# Patient Record
Sex: Male | Born: 1945 | Race: White | Hispanic: No | Marital: Married | Smoking: Never smoker
Health system: Southern US, Community
[De-identification: ages and names within clinical notes are randomized; demographics above are authoritative.]

## PROBLEM LIST (undated history)

## (undated) DIAGNOSIS — C801 Malignant (primary) neoplasm, unspecified: Secondary | ICD-10-CM

## (undated) DIAGNOSIS — G709 Myoneural disorder, unspecified: Secondary | ICD-10-CM

## (undated) DIAGNOSIS — H269 Unspecified cataract: Secondary | ICD-10-CM

## (undated) DIAGNOSIS — M1990 Unspecified osteoarthritis, unspecified site: Secondary | ICD-10-CM

## (undated) DIAGNOSIS — E785 Hyperlipidemia, unspecified: Secondary | ICD-10-CM

## (undated) HISTORY — DX: Unspecified cataract: H26.9

## (undated) HISTORY — PX: CATARACT EXTRACTION: SUR2

## (undated) HISTORY — DX: Malignant (primary) neoplasm, unspecified: C80.1

## (undated) HISTORY — PX: CATARACT EXTRACTION, BILATERAL: SHX1313

## (undated) HISTORY — DX: Unspecified osteoarthritis, unspecified site: M19.90

## (undated) HISTORY — DX: Myoneural disorder, unspecified: G70.9

## (undated) HISTORY — DX: Hyperlipidemia, unspecified: E78.5

## (undated) HISTORY — PX: DENTAL SURGERY: SHX609

---

## 2015-06-18 ENCOUNTER — Encounter (INDEPENDENT_AMBULATORY_CARE_PROVIDER_SITE_OTHER): Payer: Self-pay | Admitting: Family Medicine

## 2015-06-18 DIAGNOSIS — Z0289 Encounter for other administrative examinations: Secondary | ICD-10-CM

## 2015-06-18 NOTE — Progress Notes (Unsigned)
Class III FAA flight physical

## 2016-07-14 ENCOUNTER — Ambulatory Visit (INDEPENDENT_AMBULATORY_CARE_PROVIDER_SITE_OTHER): Payer: Medicare Other | Admitting: Physician Assistant

## 2016-07-14 ENCOUNTER — Encounter: Payer: Self-pay | Admitting: Physician Assistant

## 2016-07-14 VITALS — BP 118/78 | HR 66 | Temp 98.0°F | Resp 67 | Ht 70.0 in | Wt 205.2 lb

## 2016-07-14 DIAGNOSIS — Z23 Encounter for immunization: Secondary | ICD-10-CM | POA: Diagnosis not present

## 2016-07-14 DIAGNOSIS — Z0000 Encounter for general adult medical examination without abnormal findings: Secondary | ICD-10-CM | POA: Diagnosis not present

## 2016-07-14 NOTE — Progress Notes (Signed)
Patient ID: Donald Rose MRN: [id number], DOB: 08-16-1945 71 y.o. Date of Encounter: 07/14/2016, 9:05 AM    Chief Complaint: New Patient/ Establish Care---- Physical (CPE)  HPI: 71 y.o. y/o male here for above.  He reports that he does not go to any type of medical provider often. The last time he went to any doctor was for his flight physical. 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 Does not fly as his job. Does not go out "just to fly". Says that he mostly "flies when he needs to get somewhere."  He brings in form that he has annually as part of his flight physical. Says this has all of his medical history documented on it so he brought it to share those records. Says that syncope is checked off just because he passed out one time when he was in church as a child. Says it was because it was 90 and he had had no food that morning. No other syncope. He does wear eyeglasses. Also has documented positive hematuria. Says that he had an episode of hematuria years ago. He had been sledding in snow and ran into an object. 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 He then saw gross hematuria. Says that he saw Urology and had workup with multiple tests. 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 History of shingles that was on his left anterior chest. Says that in the past 1-2 months when we had some snow he was shoveling and developed some increased back pain. Has seen chiropractor and massage therapist. Also his wife recently had total knee replacement one week ago-- he has been doing a lot of bending and holding her leg up etc. Says all of this has affected his low back pain. However has remained very active.  just the other day he woke up at 5:30 AM care for his wife's knee then loaded a keyboard to take to the church and was at the church all day  etc. Makes mention of playing golf through the visit. Since that his wife's family has gone to [place] and [place] golfing.  No specific concerns to address today. Is agreeable to do physical and update preventive care.    Review of Systems: Consitutional: No fever, chills, fatigue, night sweats, lymphadenopathy, or weight changes. Eyes: No visual changes, eye redness, or discharge. ENT/Mouth: Ears: No otalgia, tinnitus, hearing loss, discharge. Nose: No congestion, rhinorrhea, sinus pain, or epistaxis. Throat: No sore throat, post nasal drip, or teeth pain. Cardiovascular: No CP, palpitations, diaphoresis, DOE, edema, orthopnea, PND. Respiratory: No cough, hemoptysis, SOB, or wheezing. Gastrointestinal: No anorexia, dysphagia, reflux, pain, nausea, vomiting, hematemesis, diarrhea, constipation, BRBPR, or melena. Genitourinary: No dysuria, frequency, urgency, hematuria, incontinence, nocturia, decreased urinary stream, discharge, impotence, or testicular pain/masses. Musculoskeletal: No decreased ROM, myalgias, stiffness, joint swelling, or weakness. Skin: No rash, erythema, lesion changes, pain, warmth, jaundice, or pruritis. Neurological: No headache, dizziness, syncope, seizures, tremors, memory loss, coordination problems, or paresthesias. Psychological: No anxiety, depression, hallucinations, SI/HI. Endocrine: No fatigue, polydipsia, polyphagia, polyuria, or known diabetes. All other systems were reviewed and are otherwise negative.  Past Medical History:  Diagnosis Date  . 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 Hyperlipidemia      History reviewed. No pertinent surgical history.  Home Meds:  No outpatient prescriptions  prior to visit.   No facility-administered medications prior to visit.     Allergies: Allergies not on file  Social History   Social History  . Marital status: Married    Spouse name: N/A  . Number of children: N/A  . Years of education: N/A    Occupational History  . Not on file.   Social History Main Topics  . Smoking status: Never Smoker  . Smokeless tobacco: Never Used  . Alcohol use No  . Drug use: No  . Sexual activity: Not on file   Other Topics Concern  . Not on file   Social History Narrative  . No narrative on file    Family History  Problem Relation Age of Onset  . Arthritis Mother   . Cancer Mother   . Cancer Father   . Early death Son   . Arthritis Maternal Grandmother   . Arthritis Paternal Grandmother     Physical Exam: Blood pressure 118/78, pulse 66, temperature 98 F (36.7 C), temperature source Oral, resp. rate (!) 67, height 5\' 10"  (1.778 m), weight 205 lb 3.2 oz (93.1 kg), SpO2 97 %.  General: Well developed, well nourished WM. Appears in no acute distress. HEENT: Normocephalic, atraumatic. Conjunctiva pink, sclera non-icteric. Pupils 2 mm constricting to 1 mm, round, regular, and equally reactive to light and accomodation. EOMI. Internal auditory canal clear. TMs with good cone of light and without pathology. Nasal mucosa pink. Nares are without discharge. No sinus tenderness. Oral mucosa pink. Pharynx without exudate.   Neck: Supple. Trachea midline. No thyromegaly. Full ROM. No lymphadenopathy. No carotid bruit. Lungs: Clear to auscultation bilaterally without wheezes, rales, or rhonchi. Breathing is of normal effort and unlabored. Cardiovascular: RRR with S1 S2. No murmurs, rubs, or gallops. Distal pulses 2+ symmetrically. No carotid or abdominal bruits. Abdomen: Soft, non-tender, non-distended with normoactive bowel sounds. No hepatosplenomegaly or masses. No rebound/guarding. No CVA tenderness. No hernias. Rectal: No external hemorrhoids or fissures. Rectal vault without masses. Prostate gland firm and smooth. No nodularity, tenderness, mass, or induration.  Musculoskeletal: Full range of motion and 5/5 strength throughout.  Skin: Warm and moist without erythema, ecchymosis, wounds, or  rash. Neuro: A+Ox3. CN II-XII grossly intact. Moves all extremities spontaneously. Full sensation throughout. Normal gait.  Psych:  Responds to questions appropriately with a normal affect.   Assessment/Plan:  71 y.o. y/o white male here for CPE  -1. Encounter for preventive care  A. Screening Labs: He is not fasting today but states that he will return fasting tomorrow morning for labs. - CBC with Differential/Platelet; Future - COMPLETE METABOLIC PANEL WITH GFR; Future - Lipid panel; Future - TSH; Future - PSA; Future - VITAMIN D 25 Hydroxy (Vit-D Deficiency, Fractures); Future  B. Screening For Prostate Cancer: - PSA; Future  C. Screening For Colorectal Cancer:  States that he has never had colonoscopy but is agreeable to have this. - Ambulatory referral to Gastroenterology   D. Immunizations: Flu---------------------N/A Tetanus---------------Not covered by Medicare Pneumococcal-------He states he has never had Pneumonia Vaccine. Agreeable to get Prevnar 13 today. Will give Pneumovax 6 -12 months later Shingles Vaccine-----He has had Shingles. Does not want to get shingles vaccine right now  - Pneumococcal conjugate vaccine 13-valent IM  2. Medicare annual wellness visit, subsequent Subjective:   Patient presents for Medicare Annual/Subsequent preventive examination.   Review Past Medical/Family/Social: All of this information is reviewed and documented today.   Risk Factors  Current exercise habits: He is very active with projects  around the house and the church and golfing. Dietary issues discussed: He eats a healthy diet. Low saturated fat,  low-sodium diet.  Cardiac risk factors: Male, Age  Depression Screen  (Note: if answer to either of the following is "Yes", a more complete depression screening is indicated)  Over the past two weeks, have you felt down, depressed or hopeless? No Over the past two weeks, have you felt little interest or pleasure in doing  things? No Have you lost interest or pleasure in daily life? No Do you often feel hopeless? No Do you cry easily over simple problems? No   Activities of Daily Living  In your present state of health, do you have any difficulty performing the following activities?:  Driving? No  Managing money? No  Feeding yourself? No  Getting from bed to chair? No  Climbing a flight of stairs? No  Preparing food and eating?: No  Bathing or showering? No  Getting dressed: No  Getting to the toilet? No  Using the toilet:No  Moving around from place to place: No  In the past year have you fallen or had a near fall?:No  Are you sexually active? No  Do you have more than one partner? No   Hearing Difficulties: No  Do you often ask people to speak up or repeat themselves? No  Do you experience ringing or noises in your ears? No Do you have difficulty understanding soft or whispered voices? No  Do you feel that you have a problem with memory? No Do you often misplace items? No  Do you feel safe at home? Yes  Cognitive Testing  Alert? Yes Normal Appearance?Yes  Oriented to person? Yes Place? Yes  Time? Yes  Recall of three objects? Yes  Can perform simple calculations? 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 Sees no other medical providers.  Indicate any recent Medical Services you may have received from other than [facility] providers in the past year (date may be approximate).  --None Screening Tests / Date--- None---- Colonoscopy                     Zostavax  Mammogram  Influenza Vaccine  Tetanus/tdap    Assessment:    Annual wellness medicare exam   Plan:    During the course of the visit the patient was educated and counseled about appropriate screening and preventive services including:  Screening mammography  Colorectal  cancer screening  Shingles vaccine. Prescription given to that she can get the vaccine at the pharmacy or Medicare part D.  Screen + for depression. PHQ- 9 score of 12 (moderate depression). We discussed the options of counseling versus possibly a medication. I encouraged her strongly think about the counseling. She is going through some medical problems currently and her husband is as well Mrs. been very stressful for her. She says she will think about it. She does have Xanax to use as needed. Though she may benefit from an SSRI for her more depressive type symptoms but she wants to hold off at this time.  I aksed her to please have her cardioloist send records since we have none on file.  Diet review for nutrition referral? Yes ____ Not Indicated __x__  Patient Instructions (the written plan) was given to the patient.  Medicare Attestation  I  have personally reviewed:  The patient's medical and social history  Their use of alcohol, tobacco or illicit drugs  Their current medications and supplements  The patient's functional ability including ADLs,fall risks, home safety risks, cognitive, and hearing and visual impairment  Diet and physical activities  Evidence for depression or mood disorders  The patient's weight, height, BMI, and visual acuity have been recorded in the chart. I have made referrals, counseling, and provided education to the patient based on review of the above and I have provided the patient with a written personalized care plan for preventive services.           Signed:   [address] [place], [place]  07/14/2016 9:05 AM

## 2016-07-16 ENCOUNTER — Other Ambulatory Visit: Payer: Medicare Other

## 2016-07-16 DIAGNOSIS — Z0000 Encounter for general adult medical examination without abnormal findings: Secondary | ICD-10-CM

## 2016-07-16 LAB — CBC WITH DIFFERENTIAL/PLATELET
BASOS PCT: 0 %
Basophils Absolute: 0 cells/uL (ref 0–200)
EOS PCT: 3 %
Eosinophils Absolute: 204 cells/uL (ref 15–500)
HEMATOCRIT: 42.1 % (ref 38.5–50.0)
Hemoglobin: 14.1 g/dL (ref 13.0–17.0)
Lymphocytes Relative: 31 %
Lymphs Abs: 2108 cells/uL (ref 850–3900)
MCH: 30.2 pg (ref 27.0–33.0)
MCHC: 33.5 g/dL (ref 32.0–36.0)
MCV: 90.1 fL (ref 80.0–100.0)
MPV: 8.9 fL (ref 7.5–12.5)
Monocytes Absolute: 544 cells/uL (ref 200–950)
Monocytes Relative: 8 %
NEUTROS ABS: 3944 {cells}/uL (ref 1500–7800)
Neutrophils Relative %: 58 %
Platelets: 183 10*3/uL (ref 140–400)
RBC: 4.67 MIL/uL (ref 4.20–5.80)
RDW: 14.6 % (ref 11.0–15.0)
WBC: 6.8 10*3/uL (ref 3.8–10.8)

## 2016-07-16 LAB — LIPID PANEL
Cholesterol: 203 mg/dL — ABNORMAL HIGH (ref <200)
HDL: 43 mg/dL (ref >40)
LDL Cholesterol: 131 mg/dL — ABNORMAL HIGH (ref <100)
TRIGLYCERIDES: 143 mg/dL (ref <150)
Total CHOL/HDL Ratio: 4.7 Ratio (ref <5.0)
VLDL: 29 mg/dL (ref <30)

## 2016-07-16 LAB — COMPLETE METABOLIC PANEL WITH GFR
AG Ratio: 1.4 Ratio (ref 1.0–2.5)
ALBUMIN: 3.9 g/dL (ref 3.6–5.1)
ALK PHOS: 59 U/L (ref 40–115)
ALT: 15 U/L (ref 9–46)
AST: 15 U/L (ref 10–35)
BILIRUBIN TOTAL: 0.5 mg/dL (ref 0.2–1.2)
BUN / CREAT RATIO: 11.8 ratio (ref 6–22)
BUN: 11 mg/dL (ref 7–25)
CHLORIDE: 109 mmol/L (ref 98–110)
CO2: 21 mmol/L (ref 20–31)
CREATININE: 0.93 mg/dL (ref 0.70–1.25)
Calcium: 8.9 mg/dL (ref 8.6–10.3)
GFR, Est African American: >89 mL/min (ref >=60)
GFR, Est Non African American: 83 mL/min (ref >=60)
Globulin: 2.7 g/dL (ref 1.9–3.7)
Glucose, Bld: 98 mg/dL (ref 70–99)
POTASSIUM: 4.2 mmol/L (ref 3.5–5.3)
SODIUM: 143 mmol/L (ref 135–146)
TOTAL PROTEIN: 6.6 g/dL (ref 6.1–8.1)

## 2016-07-17 LAB — VITAMIN D 25 HYDROXY (VIT D DEFICIENCY, FRACTURES): VIT D 25 HYDROXY: 25 ng/mL — AB (ref 30–100)

## 2016-07-17 LAB — PSA: PSA: 2.8 ng/mL (ref <=4.0)

## 2016-07-17 LAB — TSH: TSH: 0.82 mIU/L (ref 0.40–4.50)

## 2016-08-13 ENCOUNTER — Encounter: Payer: Self-pay | Admitting: Physician Assistant

## 2016-08-30 ENCOUNTER — Ambulatory Visit (INDEPENDENT_AMBULATORY_CARE_PROVIDER_SITE_OTHER): Payer: Medicare Other | Admitting: Physician Assistant

## 2016-08-30 ENCOUNTER — Encounter: Payer: Self-pay | Admitting: Physician Assistant

## 2016-08-30 VITALS — BP 124/78 | HR 68 | Temp 97.8°F | Resp 16 | Wt 203.0 lb

## 2016-08-30 DIAGNOSIS — L237 Allergic contact dermatitis due to plants, except food: Secondary | ICD-10-CM | POA: Diagnosis not present

## 2016-08-30 MED ORDER — METHYLPREDNISOLONE ACETATE 80 MG/ML IJ SUSP
80.0000 mg | Freq: Once | INTRAMUSCULAR | Status: AC
  Administered 2016-08-30: 80 mg via INTRAMUSCULAR

## 2016-08-30 NOTE — Addendum Note (Signed)
Addended by: Vonna Kotyk A on: 08/30/2016 10:28 AM   Modules accepted: Orders

## 2016-08-30 NOTE — Progress Notes (Signed)
    Patient ID: Donald Rose MRN: [id number], DOB: 09-24-1945, 71 y.o. Date of Encounter: 08/30/2016, 9:53 AM    Chief Complaint:  Chief Complaint  Patient presents with  . Rash    spreading on arm, fingers and thighs      HPI: 71 y.o. year old male presents with above.   States that a bulldozer has been clearing some land next to his planned. He recently has been out mowing his grass and thinks that he must have gotten in contact with something at that time. Points to blisters that are in a linear distribution on his left hand and says that it started with that area of rash. He also has some splotchy areas on his right forearm and a lot more splotchy areas on his right upper arm. Says that he also has some areas of rash on both of his thighs where his shorts came down to that level. Has noticed no other areas of rash. States that the rash is very itchy. States that he has never had problems being real sensitive to poison oak or poison ivy and has never had to go in to a medical provider for treatment of this before. No other complaints or concerns.     Home Meds:   No outpatient prescriptions prior to visit.   No facility-administered medications prior to visit.     Allergies: No Known Allergies    Review of Systems: See HPI for pertinent ROS. All other ROS negative.    Physical Exam: Blood pressure 124/78, pulse 68, temperature 97.8 F (36.6 C), temperature source Oral, resp. rate 16, weight 203 lb (92.1 kg), SpO2 98 %., Body mass index is 29.13 kg/m. General:  WNWD WM. Appears in no acute distress. Neck: Supple. No thyromegaly. No lymphadenopathy. Lungs: Clear bilaterally to auscultation without wheezes, rales, or rhonchi. Breathing is unlabored. Heart: Regular rhythm. No murmurs, rubs, or gallops. Msk:  Strength and tone normal for age. Extremities/Skin: Left hand he has area of blisters in a linear distribution. Right forearm he has few scattered splotchy erythema areas.  Right upper arm has much more areas of splotchy erythema. Neuro: Alert and oriented X 3. Moves all extremities spontaneously. Gait is normal. CNII-XII grossly in tact. Psych:  Responds to questions appropriately with a normal affect.     ASSESSMENT AND PLAN:  71 y.o. year old male with  1. Allergic contact dermatitis due to plant Will give Depo-Medrol 80 mg IM now. He is to follow up if itching and rash do not resolve or if they reoccur.   Signed, [address] [place], [place], [facility] 08/30/2016 9:53 AM

## 2016-09-27 ENCOUNTER — Encounter: Payer: Self-pay | Admitting: Physician Assistant

## 2016-09-27 ENCOUNTER — Ambulatory Visit (INDEPENDENT_AMBULATORY_CARE_PROVIDER_SITE_OTHER): Payer: Medicare Other | Admitting: Physician Assistant

## 2016-09-27 VITALS — BP 118/74 | HR 69 | Temp 98.4°F | Resp 16 | Ht 70.0 in | Wt 200.4 lb

## 2016-09-27 DIAGNOSIS — M5432 Sciatica, left side: Secondary | ICD-10-CM | POA: Diagnosis not present

## 2016-09-27 MED ORDER — METAXALONE 800 MG PO TABS: 800.0000 mg | ORAL_TABLET | Freq: Three times a day (TID) | ORAL | 1 refills | Status: DC

## 2016-09-27 MED ORDER — PREDNISONE 20 MG PO TABS: ORAL_TABLET | ORAL | 0 refills | Status: DC

## 2016-09-27 NOTE — Progress Notes (Signed)
Patient ID: Donald Rose MRN: [id number], DOB: 1945-11-05, 71 y.o. Date of Encounter: 09/27/2016, 12:51 PM    Chief Complaint:  Chief Complaint  Patient presents with  . Leg Pain    left      HPI: 71 y.o. year old male presetns with above.   Patient states that he has been having different areas of different types of aches pains etc. since February when he was shoveling snow. Says that soon after shoveling snow in February he was having some pain in his "right hip "and saw a chiropractor and massage therapist. Says that he was then in [place] in mid-March and while in [place] when walking he would feel tingling sensation down the lateral aspect of the left leg. Says that this tingling sensation has continued to come and go. Says that when he has been lying down and then gets up -- that he feels okay for a while. Says that he starts noticing this tingling sensation when he has been walking a lot. Says that when he is playing golf --- just to go from the cart to the Green---he will feel it (tingling sensation down his left leg). Says that he has also noticed that he cannot lift his left thigh / left leg up as much as usual and as much as he can with the right one. Says that even with putting his pants on etc. he notices this. Says that he has not felt any "pain" in the leg ---  Has just noticed this tingling sensation and decreased range of motion. He has had no incontinence of bowel or bladder. No saddle anesthesia.    Home Meds:   No outpatient prescriptions prior to visit.   No facility-administered medications prior to visit.     Allergies: No Known Allergies    Review of Systems: See HPI for pertinent ROS. All other ROS negative.    Physical Exam: Blood pressure 118/74, pulse 69, temperature 98.4 F (36.9 C), temperature source Oral, resp. rate 16, height 5\' 10"  (1.778 m), weight 200 lb 6.4 oz (90.9 kg), SpO2 95 %., Body mass index is 28.75 kg/m. General:  WNWD WM.  Appears in no acute distress. Neck: Supple. No thyromegaly. No lymphadenopathy. Lungs: Clear bilaterally to auscultation without wheezes, rales, or rhonchi. Breathing is unlabored. Heart: Regular rhythm. No murmurs, rubs, or gallops. Msk:  Strength and tone normal for age. When I palpate the left sciatic notch this reproduces the tingling sensation down his leg. When I palpate the right sciatic notch this produces no symptoms. Straight leg raise is normal bilaterally. Hip abduction is normal bilaterally. Extremities/Skin: Warm and dry.  Neuro: Alert and oriented X 3. Moves all extremities spontaneously. Gait is normal. CNII-XII grossly in tact. Psych:  Responds to questions appropriately with a normal affect.     ASSESSMENT AND PLAN:  71 y.o. year old male with  1. Left sided sciatica He does fly planes. Discussed definitely not to use the Skelaxin prior to flying at all.  Discussed that in general even for just regular daily activities that the Skelaxin may cause drowsiness and for him to try this at home first and if it does cause drowsiness then only take at bedtime. He is to take the prednisone taper as directed. Will obtain x-ray. I will follow-up with him with x-ray report/result. As well he is to follow-up with me if symptoms do not resolve with use of prednisone and Skelaxin. Routine stretching would be very beneficial. Also applying heat  will help. - predniSONE (DELTASONE) 20 MG tablet; Take 3 daily for 2 days, then 2 daily for 2 days, then 1 daily for 2 days.  Dispense: 12 tablet; Refill: 0 - metaxalone (SKELAXIN) 800 MG tablet; Take 1 tablet (800 mg total) by mouth 3 (three) times daily.  Dispense: 30 tablet; Refill: 1 - DG Lumbar Spine Complete; Future   Signed, Olean Ree [place], [place], [facility] 09/27/2016 12:51 PM

## 2016-09-28 ENCOUNTER — Ambulatory Visit
Admission: RE | Admit: 2016-09-28 | Discharge: 2016-09-28 | Disposition: A | Discharge status: Home / Self Care | Payer: Medicare Other | Source: Ambulatory Visit | Attending: Physician Assistant | Admitting: Physician Assistant

## 2016-09-28 ENCOUNTER — Telehealth: Payer: Self-pay

## 2016-09-28 DIAGNOSIS — M5432 Sciatica, left side: Secondary | ICD-10-CM

## 2016-09-28 MED ORDER — CYCLOBENZAPRINE HCL 10 MG PO TABS: 10.0000 mg | ORAL_TABLET | Freq: Three times a day (TID) | ORAL | 1 refills | Status: DC | PRN

## 2016-09-28 NOTE — Telephone Encounter (Signed)
Medication change made. 

## 2016-09-28 NOTE — Telephone Encounter (Signed)
Metaxalone is not covered by patient insurance. Alternatives are as follows  Celecoxib, diflunisal and Etodolac and  Ibuprofen which if any would you like to switch to?

## 2016-09-28 NOTE — Telephone Encounter (Signed)
Flexeril 10mg --- 1 po TID prn --- muscle relaxer--- # 30 + 1

## 2016-10-05 ENCOUNTER — Telehealth: Payer: Self-pay

## 2016-10-05 NOTE — Telephone Encounter (Signed)
Patient called and states he has not had much relief after taking prednisone. I explained patient x-ray results and that if he is still experiencing numbness and pain in his leg then he would need to follow up with Olean Ree on or after 7-30 when she comes back from vacation.   Patient was also instructed to take his flexeril to see if he can find relief with that because patient stated he had not been taking the flexeril. Patient agrees and will follow up if need be.

## 2017-06-29 ENCOUNTER — Encounter: Payer: Self-pay | Admitting: Family Medicine

## 2017-06-29 ENCOUNTER — Ambulatory Visit: Payer: Self-pay | Admitting: Family Medicine

## 2017-06-29 VITALS — BP 136/74 | HR 74 | Ht 71.0 in | Wt 197.0 lb

## 2017-06-29 DIAGNOSIS — Z029 Encounter for administrative examinations, unspecified: Secondary | ICD-10-CM

## 2017-06-29 DIAGNOSIS — Z0289 Encounter for other administrative examinations: Secondary | ICD-10-CM

## 2017-06-29 NOTE — Progress Notes (Signed)
   BP 136/74   Pulse 74   Ht 5\' 11"  (1.803 m)   Wt 197 lb (89.4 kg)   BMI 27.48 kg/m    Subjective:    Patient ID: Donald Rose, male    DOB: [birth year], 72 y.o.   MRN: [id number]  HPI: Donald Rose is a 72 y.o. male  Chief Complaint  Patient presents with  . Flight    Class 3    Relevant past medical, surgical, family and social history reviewed and updated as indicated. Interim medical history since our last visit reviewed. Allergies and medications reviewed and updated.  Review of Systems  Constitutional: Negative.   HENT: Negative.   Eyes: Negative.   Respiratory: Negative.   Cardiovascular: Negative.   Gastrointestinal: Negative.   Endocrine: Negative.   Genitourinary: Negative.   Musculoskeletal: Negative.   Skin: Negative.   Allergic/Immunologic: Negative.   Neurological: Negative.   Hematological: Negative.   Psychiatric/Behavioral: Negative.     Per HPI unless specifically indicated above     Objective:    BP 136/74   Pulse 74   Ht 5\' 11"  (1.803 m)   Wt 197 lb (89.4 kg)   BMI 27.48 kg/m   Wt Readings from Last 3 Encounters:  06/29/17 197 lb (89.4 kg)  09/27/16 200 lb 6.4 oz (90.9 kg)  08/30/16 203 lb (92.1 kg)    Physical Exam  Constitutional: He is oriented to person, place, and time. He appears well-developed and well-nourished.  HENT:  Head: Normocephalic.  Right Ear: External ear normal.  Left Ear: External ear normal.  Nose: Nose normal.  Eyes: Pupils are equal, round, and reactive to light. Conjunctivae and EOM are normal.  Neck: Normal range of motion. Neck supple. No thyromegaly present.  Cardiovascular: Normal rate, regular rhythm, normal heart sounds and intact distal pulses.  Pulmonary/Chest: Effort normal and breath sounds normal.  Abdominal: Soft. Bowel sounds are normal. There is no splenomegaly or hepatomegaly.  Genitourinary: Penis normal.  Musculoskeletal: Normal range of motion.  Lymphadenopathy:    He has no cervical  adenopathy.  Neurological: He is alert and oriented to person, place, and time. He has normal reflexes.  Skin: Skin is warm and dry.  Psychiatric: He has a normal mood and affect. His behavior is normal. Judgment and thought content normal.        Assessment & Plan:   Problem List Items Addressed This Visit    None    Visit Diagnoses    Encounter for Systems analyst [facility]) examination    -  Primary       Follow up plan: Return if symptoms worsen or fail to improve.

## 2017-07-04 ENCOUNTER — Other Ambulatory Visit: Payer: Self-pay | Admitting: Internal Medicine

## 2017-07-04 DIAGNOSIS — M545 Low back pain, unspecified: Secondary | ICD-10-CM

## 2017-07-04 DIAGNOSIS — M25551 Pain in right hip: Secondary | ICD-10-CM

## 2017-07-07 ENCOUNTER — Ambulatory Visit
Admission: RE | Admit: 2017-07-07 | Discharge: 2017-07-07 | Disposition: A | Discharge status: Home / Self Care | Payer: Medicare Other | Source: Ambulatory Visit | Attending: Internal Medicine | Admitting: Internal Medicine

## 2017-07-07 DIAGNOSIS — M25551 Pain in right hip: Secondary | ICD-10-CM

## 2017-07-07 DIAGNOSIS — M545 Low back pain, unspecified: Secondary | ICD-10-CM

## 2017-08-02 ENCOUNTER — Other Ambulatory Visit: Payer: Self-pay | Admitting: Ophthalmology

## 2017-08-02 DIAGNOSIS — H47012 Ischemic optic neuropathy, left eye: Secondary | ICD-10-CM

## 2017-08-10 ENCOUNTER — Ambulatory Visit
Admission: RE | Admit: 2017-08-10 | Discharge: 2017-08-10 | Disposition: A | Discharge status: Home / Self Care | Payer: Medicare Other | Source: Ambulatory Visit | Attending: Ophthalmology | Admitting: Ophthalmology

## 2017-08-10 DIAGNOSIS — H47012 Ischemic optic neuropathy, left eye: Secondary | ICD-10-CM

## 2017-08-10 MED ORDER — IOPAMIDOL (ISOVUE-300) INJECTION 61%
75.0000 mL | Freq: Once | INTRAVENOUS | Status: AC | PRN
  Administered 2017-08-10: 75 mL via INTRAVENOUS

## 2017-10-26 ENCOUNTER — Encounter (INDEPENDENT_AMBULATORY_CARE_PROVIDER_SITE_OTHER): Payer: Medicare Other | Admitting: Ophthalmology

## 2017-10-26 DIAGNOSIS — H35373 Puckering of macula, bilateral: Secondary | ICD-10-CM

## 2017-10-26 DIAGNOSIS — H43813 Vitreous degeneration, bilateral: Secondary | ICD-10-CM

## 2017-10-26 DIAGNOSIS — H59033 Cystoid macular edema following cataract surgery, bilateral: Secondary | ICD-10-CM | POA: Diagnosis not present

## 2017-11-11 ENCOUNTER — Encounter: Payer: Self-pay | Admitting: Gastroenterology

## 2017-11-30 ENCOUNTER — Encounter (INDEPENDENT_AMBULATORY_CARE_PROVIDER_SITE_OTHER): Payer: Medicare Other | Admitting: Ophthalmology

## 2017-11-30 DIAGNOSIS — H59031 Cystoid macular edema following cataract surgery, right eye: Secondary | ICD-10-CM | POA: Diagnosis not present

## 2017-11-30 DIAGNOSIS — H35372 Puckering of macula, left eye: Secondary | ICD-10-CM

## 2017-11-30 DIAGNOSIS — H43813 Vitreous degeneration, bilateral: Secondary | ICD-10-CM | POA: Diagnosis not present

## 2017-12-20 HISTORY — PX: COLONOSCOPY W/ POLYPECTOMY: SHX1380

## 2017-12-21 ENCOUNTER — Ambulatory Visit (AMBULATORY_SURGERY_CENTER): Payer: Self-pay | Admitting: *Deleted

## 2017-12-21 ENCOUNTER — Encounter: Payer: Self-pay | Admitting: Gastroenterology

## 2017-12-21 VITALS — Ht 70.0 in | Wt 195.0 lb

## 2017-12-21 DIAGNOSIS — Z1211 Encounter for screening for malignant neoplasm of colon: Secondary | ICD-10-CM

## 2017-12-21 NOTE — Progress Notes (Signed)
No egg or soy allergy known to patient  No issues with past sedation with any surgeries  or procedures, no intubation problems  No diet pills per patient No home 02 use per patient  No blood thinners per patient  Pt denies issues with constipation  No A fib or A flutter  EMMI video sent to pt's e mail pt declined   

## 2017-12-29 IMAGING — CR DG LUMBAR SPINE COMPLETE 4+V
5 series · 5 of 5 positions shown · non-contrast
Comparison: None.

CLINICAL DATA: Chronic progressive left-sided low back pain. Left
leg weakness and pain.

EXAM:
LUMBAR SPINE - COMPLETE 4+ VIEW

[w lumbar spine ap]
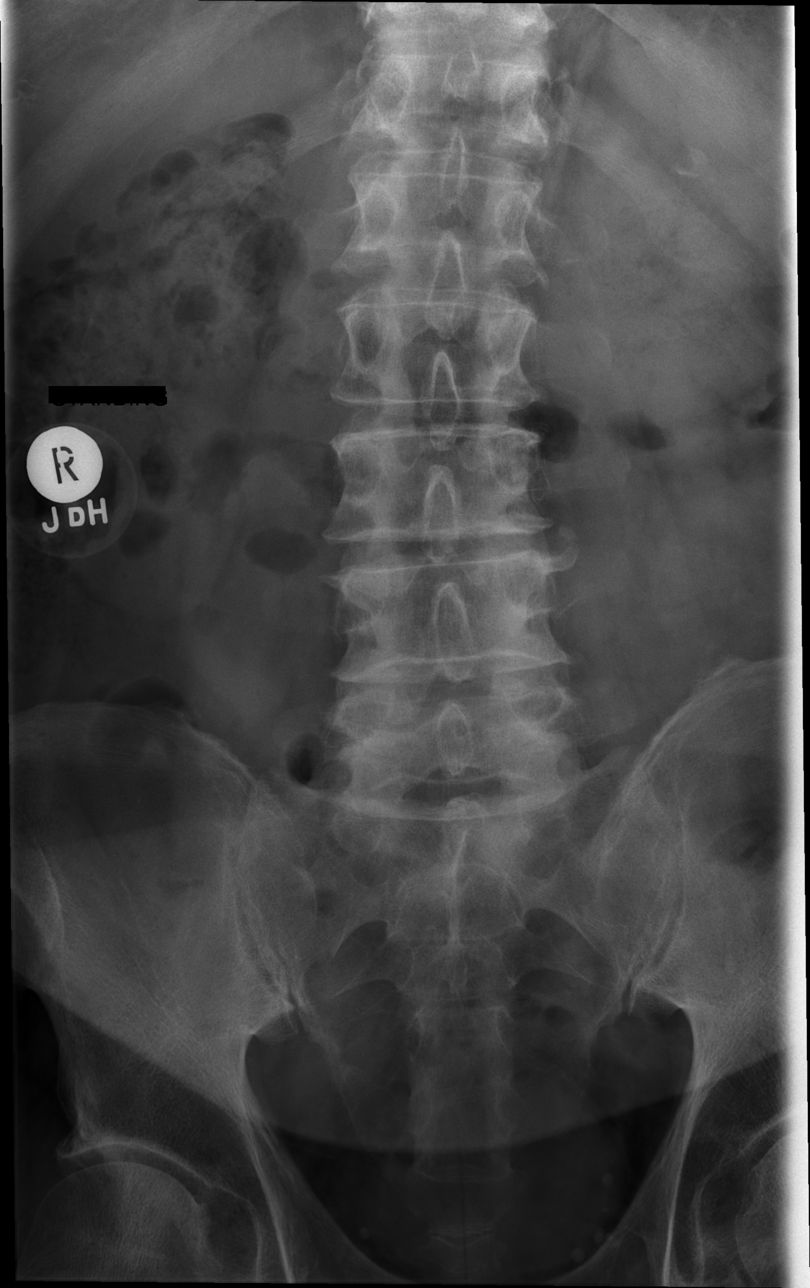

[w lumbar spine obl (1 of 2)]
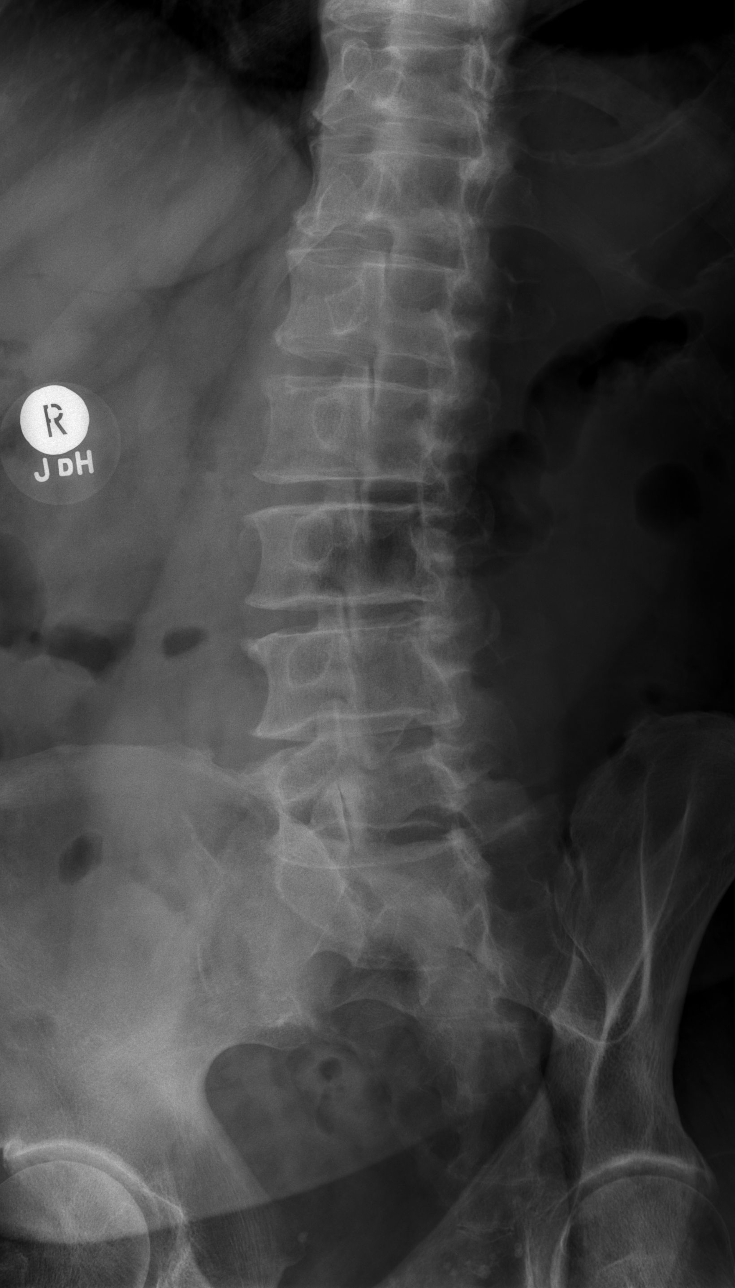

[w lumbar spine obl (2 of 2)]
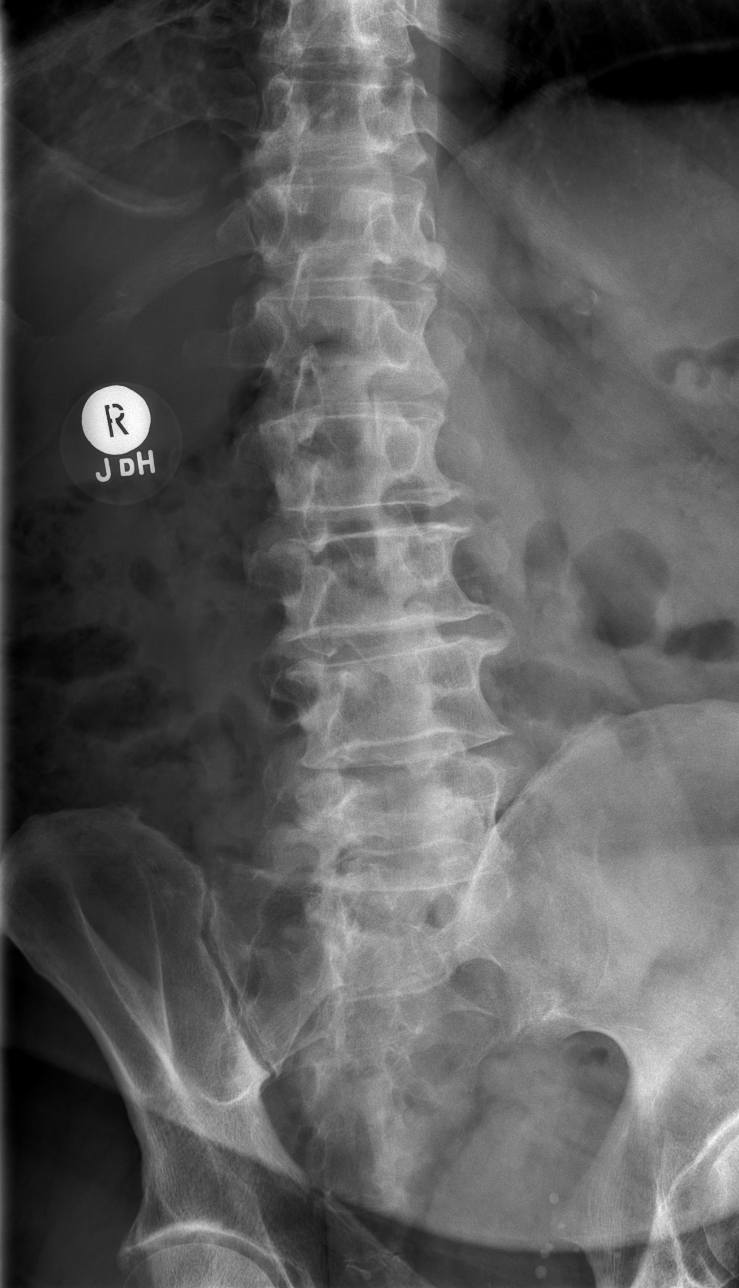

[w lumbar spine lat]
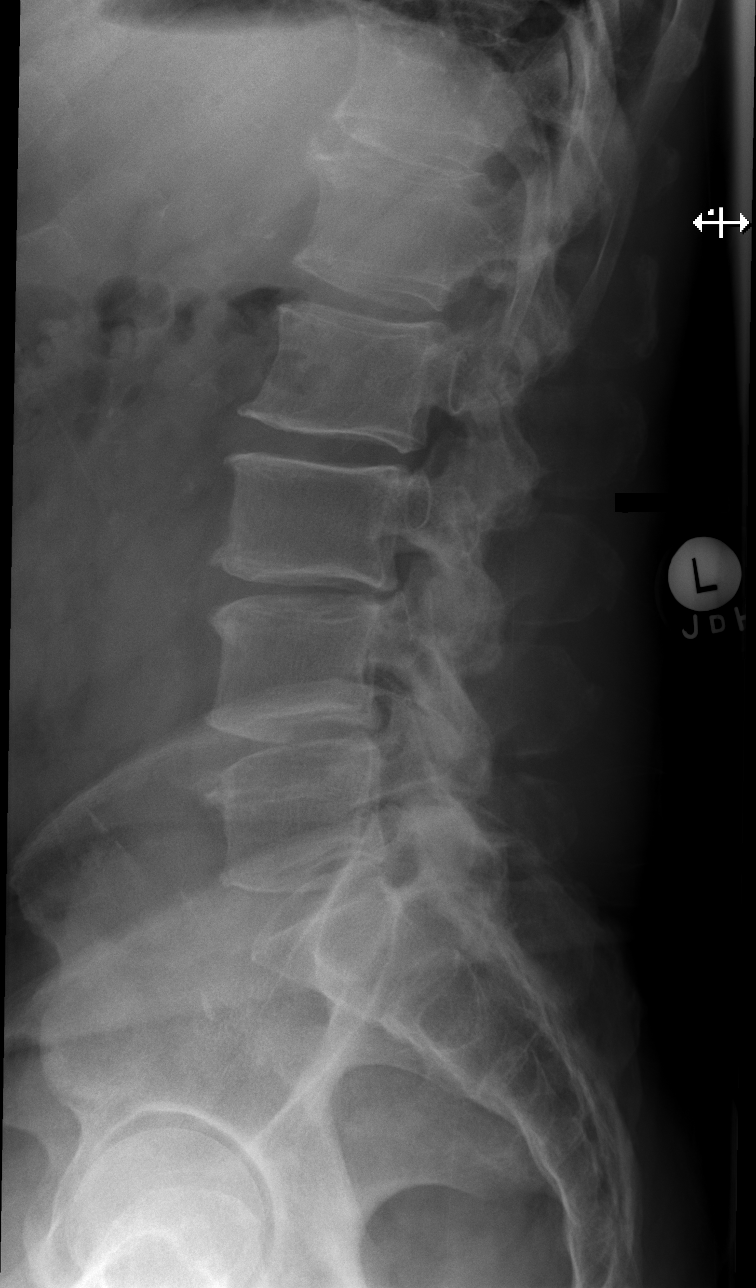

[w lumbar l-5 s-1 spot]
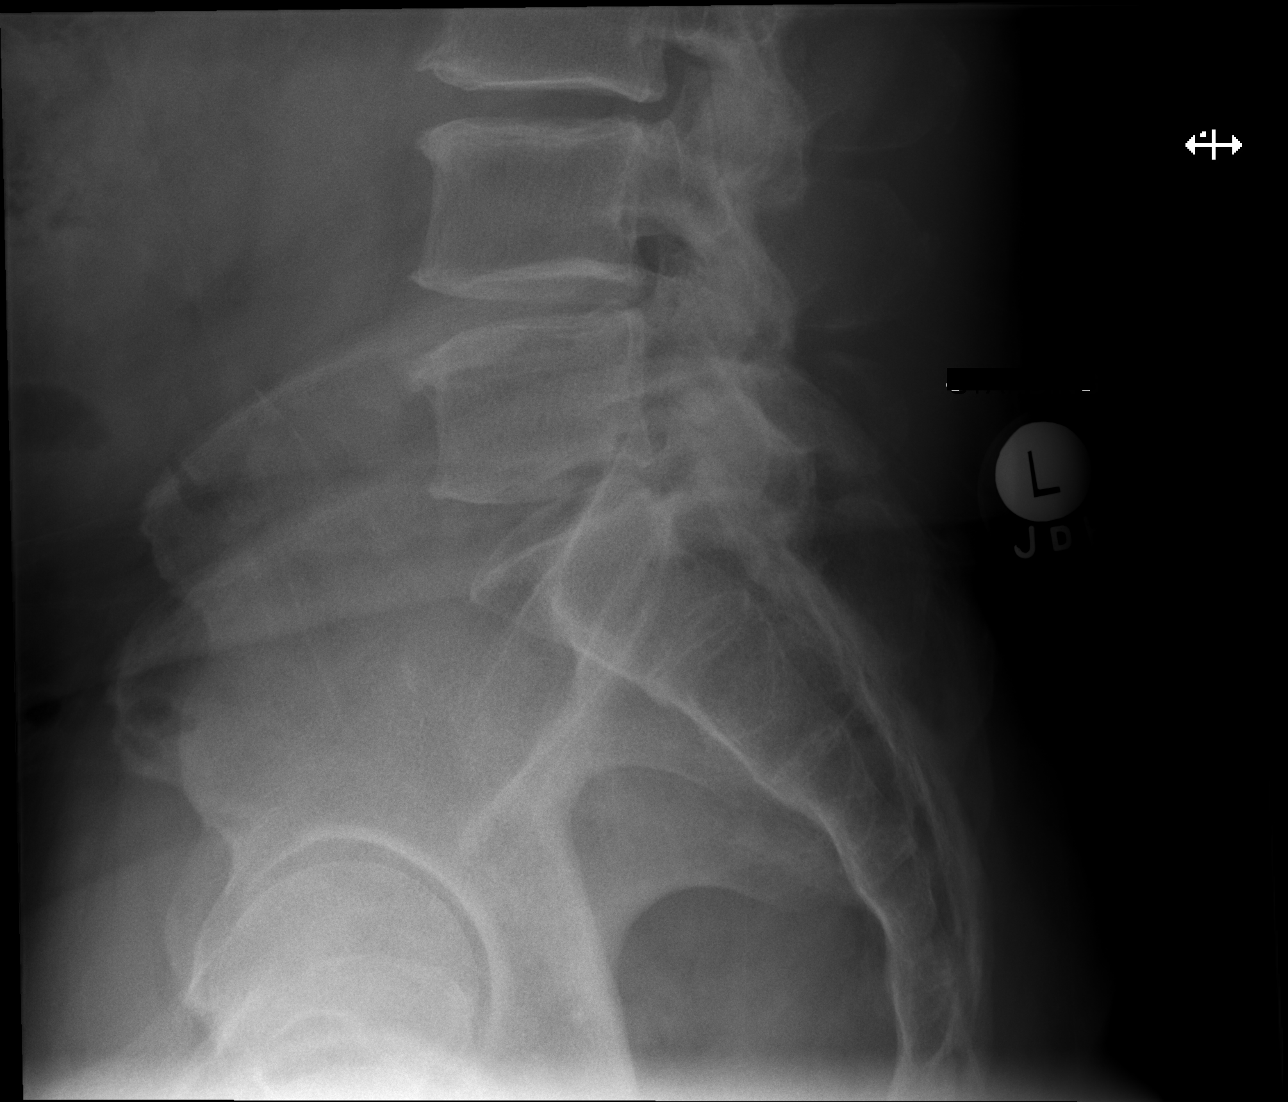

[5 of 5 positions shown; findings below may reference images not displayed]

FINDINGS: There is no fracture or bone destruction. Alignment is anatomic. No
disc space narrowing. Minimal degenerative changes of the facet
joints at L4-5 and L5-S1.

Scattered calcifications in the abdominal aorta.
IMPRESSION: No acute abnormality.  Mild chronic degenerative facet arthritis.

Aortic atherosclerosis.

## 2018-01-04 ENCOUNTER — Encounter: Payer: Self-pay | Admitting: Gastroenterology

## 2018-01-04 ENCOUNTER — Ambulatory Visit (AMBULATORY_SURGERY_CENTER): Payer: Medicare Other | Admitting: Gastroenterology

## 2018-01-04 ENCOUNTER — Other Ambulatory Visit: Payer: Self-pay

## 2018-01-04 VITALS — BP 118/71 | HR 62 | Temp 98.7°F | Resp 20 | Ht 70.0 in | Wt 195.0 lb

## 2018-01-04 DIAGNOSIS — D125 Benign neoplasm of sigmoid colon: Secondary | ICD-10-CM | POA: Diagnosis not present

## 2018-01-04 DIAGNOSIS — D123 Benign neoplasm of transverse colon: Secondary | ICD-10-CM | POA: Diagnosis not present

## 2018-01-04 DIAGNOSIS — Z1211 Encounter for screening for malignant neoplasm of colon: Secondary | ICD-10-CM

## 2018-01-04 MED ORDER — SODIUM CHLORIDE 0.9 % IV SOLN: 500.0000 mL | Freq: Once | INTRAVENOUS | Status: DC

## 2018-01-04 NOTE — Progress Notes (Signed)
Patient ID: Donald Rose, male   DOB: 1945-09-29, 72 y.o.   MRN: [id number]   Pt's states no medical or surgical changes since previsit or office visit.

## 2018-01-04 NOTE — Patient Instructions (Signed)
   Thank you for allowing [place] to care for you today!  Resume previous diet and medications today.  Return to normal activities tomorrow.  Await pathology results , will advise recommendations for next colonoscopy once final.  Handout for polyps provided.    YOU HAD AN ENDOSCOPIC PROCEDURE TODAY AT [facility]:   Refer to the procedure report that was given to you for any specific questions about what was found during the examination.  If the procedure report does not answer your questions, please call your gastroenterologist to clarify.  If you requested that your care partner not be given the details of your procedure findings, then the procedure report has been included in a sealed envelope for you to review at your convenience later.  YOU SHOULD EXPECT: Some feelings of bloating in the abdomen. Passage of more gas than usual.  Walking can help get rid of the air that was put into your GI tract during the procedure and reduce the bloating. If you had a lower endoscopy (such as a colonoscopy or flexible sigmoidoscopy) you may notice spotting of blood in your stool or on the toilet paper. If you underwent a bowel prep for your procedure, you may not have a normal bowel movement for a few days.  Please Note:  You might notice some irritation and congestion in your nose or some drainage.  This is from the oxygen used during your procedure.  There is no need for concern and it should clear up in a day or so.  SYMPTOMS TO REPORT IMMEDIATELY:   Following lower endoscopy (colonoscopy or flexible sigmoidoscopy):  Excessive amounts of blood in the stool  Significant tenderness or worsening of abdominal pains  Swelling of the abdomen that is new, acute  Fever of 100F or higher  For urgent or emergent issues, a gastroenterologist can be reached at any hour by calling [phone].   DIET:  We do recommend a small meal at first, but then you may proceed to your regular diet.   Drink plenty of fluids but you should avoid alcoholic beverages for 24 hours.  ACTIVITY:  You should plan to take it easy for the rest of today and you should NOT DRIVE or use heavy machinery until tomorrow (because of the sedation medicines used during the test).    FOLLOW UP: Our staff will call the number listed on your records the next business day following your procedure to check on you and address any questions or concerns that you may have regarding the information given to you following your procedure. If we do not reach you, we will leave a message.  However, if you are feeling well and you are not experiencing any problems, there is no need to return our call.  We will assume that you have returned to your regular daily activities without incident.  If any biopsies were taken you will be contacted by phone or by letter within the next 1-3 weeks.  Please call us at [phone] if you have not heard about the biopsies in 3 weeks.    SIGNATURES/CONFIDENTIALITY: You and/or your care partner have signed paperwork which will be entered into your electronic medical record.  These signatures attest to the fact that that the information above on your After Visit Summary has been reviewed and is understood.  Full responsibility of the confidentiality of this discharge information lies with you and/or your care-partner.

## 2018-01-04 NOTE — Op Note (Signed)
[facility] Patient Name: Donald Rose Procedure Date: 01/04/2018 8:45 AM MRN: [id number] Endoscopist: Thornton Park MD, MD Age: 72 Referring MD:  Date of Birth: 12/29/1945 Gender: Male Account #: [id number] Procedure:                Colonoscopy Indications:              Screening for colorectal malignant neoplasm. No                            family history of colon cancer or polyps. No                            baseline GI symptoms. Medicines:                See the Anesthesia note for documentation of the                            administered medications Procedure:                Pre-Anesthesia Assessment:                           - Prior to the procedure, a History and Physical                            was performed, and patient medications and                            allergies were reviewed. The patient's tolerance of                            previous anesthesia was also reviewed. The risks                            and benefits of the procedure and the sedation                            options and risks were discussed with the patient.                            All questions were answered, and informed consent                            was obtained. Prior Anticoagulants: The patient has                            taken no previous anticoagulant or antiplatelet                            agents. ASA Grade Assessment: II - A patient with                            mild systemic disease. After reviewing the risks  and benefits, the patient was deemed in                            satisfactory condition to undergo the procedure.                           After obtaining informed consent, the colonoscope                            was passed under direct vision. Throughout the                            procedure, the patient's blood pressure, pulse, and                            oxygen saturations were monitored  continuously. The                            Colonoscope was introduced through the anus and                            advanced to the the terminal ileum, with                            identification of the appendiceal orifice and IC                            valve. The colonoscopy was performed without                            difficulty. The patient tolerated the procedure                            well. The quality of the bowel preparation was good. Scope In: 8:59:48 AM Scope Out: 3:15:17 AM Scope Withdrawal Time: 0 hours 13 minutes 9 seconds  Total Procedure Duration: 0 hours 16 minutes 29 seconds  Findings:                 The perianal and digital rectal examinations were                            normal.                           A few small-mouthed diverticula were found in the                            sigmoid colon and descending colon.                           A 4 mm polyp was found in the hepatic flexure on a                            fold. The polyp was sessile. The polyp was removed  with a hot snare. Resection and retrieval were                            complete. Estimated blood loss was minimal.                           Two sessile polyps were found in the splenic                            flexure. The polyps were 2 to 3 mm in size. These                            polyps were removed with a piecemeal technique                            using a cold biopsy forceps. Resection and                            retrieval were complete.                           The exam was otherwise without abnormality on                            direct and retroflexion views. Complications:            No immediate complications. Estimated Blood Loss:     Estimated blood loss was minimal. Impression:               - Diverticulosis in the sigmoid colon and in the                            descending colon.                           - One 4 mm polyp  at the hepatic flexure, removed                            with a hot snare. Resected and retrieved.                           - Two 2 to 3 mm polyps at the splenic flexure,                            removed piecemeal using a cold biopsy forceps.                            Resected and retrieved.                           - The examination was otherwise normal on direct                            and retroflexion views. Recommendation:           -  Discharge patient to home.                           - Resume regular diet.                           - Await pathology results. Repeat colonoscopy in 5                            years if one polyp is adenomatous, 3 years if two                            or more polyps is adenomatous. I will send you a                            letter when the results are available with                            recommendations. Thornton Park MD, MD 01/04/2018 9:22:29 AM This report has been signed electronically.

## 2018-01-04 NOTE — Progress Notes (Signed)
Called to room to assist during endoscopic procedure.  Patient ID and intended procedure confirmed with present staff. Received instructions for my participation in the procedure from the performing physician.  

## 2018-01-04 NOTE — Progress Notes (Signed)
Report to PACU, RN, vss, BBS= Clear.  

## 2018-01-05 ENCOUNTER — Telehealth: Payer: Self-pay

## 2018-01-05 NOTE — Telephone Encounter (Signed)
Left message

## 2018-01-09 ENCOUNTER — Encounter: Payer: Self-pay | Admitting: Gastroenterology

## 2018-01-11 ENCOUNTER — Encounter (INDEPENDENT_AMBULATORY_CARE_PROVIDER_SITE_OTHER): Payer: Medicare Other | Admitting: Ophthalmology

## 2018-01-11 DIAGNOSIS — H59033 Cystoid macular edema following cataract surgery, bilateral: Secondary | ICD-10-CM | POA: Diagnosis not present

## 2018-01-11 DIAGNOSIS — H43813 Vitreous degeneration, bilateral: Secondary | ICD-10-CM | POA: Diagnosis not present

## 2018-02-13 ENCOUNTER — Encounter (INDEPENDENT_AMBULATORY_CARE_PROVIDER_SITE_OTHER): Payer: Self-pay | Admitting: Ophthalmology

## 2018-02-13 ENCOUNTER — Ambulatory Visit (INDEPENDENT_AMBULATORY_CARE_PROVIDER_SITE_OTHER): Payer: Medicare Other | Admitting: Ophthalmology

## 2018-02-13 DIAGNOSIS — H43813 Vitreous degeneration, bilateral: Secondary | ICD-10-CM | POA: Diagnosis not present

## 2018-02-13 DIAGNOSIS — Z961 Presence of intraocular lens: Secondary | ICD-10-CM

## 2018-02-13 DIAGNOSIS — H3581 Retinal edema: Secondary | ICD-10-CM | POA: Diagnosis not present

## 2018-02-13 NOTE — Progress Notes (Signed)
[facility] Note  02/13/2018     CHIEF COMPLAINT Patient presents for Flashes/floaters   HISTORY OF PRESENT ILLNESS: Donald Rose is a 72 y.o. male who presents to the clinic today for:   HPI    Flashes/floaters    In left eye.  This started 1 day ago.  Duration of 1 day.  Duration Constant.  Characterized as small.  Since onset it is gradually improving.  Associated Symptoms Floaters.  Context:  distance vision, mid-range vision and near vision.  Context: similar episodes in past.  Treatments tried include eye drops.  Response to treatment was no improvement.  I, the attending physician,  performed the HPI with the patient and updated documentation appropriately.          Comments    Dr. Zigmund Daniel pt.  Sudden onset floater OS x 1 day.  Describes floater as "small, round-ish, purple in color, with a small tail."  Seems to be gradually resolving.  VA OS slightly blurred as well.  VA OD good.  Denies pain, flashes.  AT prn OU.  Hx of CME OD and Cellophane OS.  S/p Kenalog inj. OS.       Last edited by Bernarda Caffey, MD on 02/13/2018  2:39 PM. (History)    pt states he is previous patient of Dr. Zigmund Daniel as well as Dr. Vevelyn Royals, pt had 1 shot of Kenalog with Dr. Zigmund Daniel in August, pt states he developed a new floater yesterday morning OS, he states they were very distinct pen lines with color, he states it has now faded some, pt states vision is seems to be a little worse and more cloudy OS   Referring physician: Leanna Battles, MD [address] [place], [place] [address]  HISTORICAL INFORMATION:   Selected notes from the MEDICAL RECORD NUMBER Drs. Zigmund Daniel and [place] pt here for increase in floaters OS x1 day LEE: 10.23.19 [place]) Ocular Hx- PMH-    CURRENT MEDICATIONS: Current Outpatient Medications (Ophthalmic Drugs)  Medication Sig  . Bromfenac Sodium (PROLENSA) 0.07 % SOLN PUT 1 DROP INTO LEFT EYE AT BEDTIME  .  Difluprednate (DUREZOL) 0.05 % EMUL INSTILL 1 DROP INTO LEFT EYE 3 TIMES A DAY   No current facility-administered medications for this visit.  (Ophthalmic Drugs)   Current Outpatient Medications (Other)  Medication Sig  . bisacodyl (DULCOLAX) 5 MG EC tablet Take 5 mg by mouth once. X 4 for colon prep 10-16  . polyethylene glycol powder (MIRALAX) powder Take 1 Container by mouth once. 238 grams for colon prep 10-16  . vitamin B-12 (CYANOCOBALAMIN) 100 MCG tablet Vitamin B12   No current facility-administered medications for this visit.  (Other)      REVIEW OF SYSTEMS: ROS    Positive for: Eyes   Negative for: Constitutional, Gastrointestinal, Neurological, Skin, Genitourinary, Musculoskeletal, HENT, Endocrine, Cardiovascular, Respiratory, Psychiatric, Allergic/Imm, Heme/Lymph   Last edited by Matthew Folks, COA on 02/13/2018  2:10 PM. (History)       ALLERGIES Allergies  Allergen Reactions  . Other     Mercurochrome     PAST MEDICAL HISTORY Past Medical History:  Diagnosis Date  . Arthritis   . 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 Cataract    removed bilat   . Hyperlipidemia   . Neuromuscular disorder ([place])    undetermined at this time    Past Surgical History:  Procedure Laterality Date  . CATARACT EXTRACTION Bilateral   .  CATARACT EXTRACTION, BILATERAL    . DENTAL SURGERY      FAMILY HISTORY Family History  Problem Relation Age of Onset  . Arthritis Mother   . Cancer Mother   . Cancer Father   . Early death Son   . Arthritis Maternal Grandmother   . Arthritis Paternal Grandmother   . Colon polyps Neg Hx   . Colon cancer Neg Hx   . Esophageal cancer Neg Hx   . Rectal cancer Neg Hx   . Stomach cancer Neg Hx     SOCIAL HISTORY Social History   Tobacco Use  . Smoking status: Never Smoker  . Smokeless tobacco: Never Used  Substance Use Topics  . Alcohol use: Yes    Comment: occasionally   . Drug use: No         OPHTHALMIC EXAM:  Base  Eye Exam    Visual Acuity (Snellen - Linear)      Right Left   Dist [place] 20/25 -2 20/30   Dist ph [place] 20/20 -2 20/25 -2       Tonometry (Tonopen, 2:12 PM)      Right Left   Pressure 8 9       Pupils      Dark Light Shape React APD   Right 4 3 Round Brisk None   Left 4 3 Round Brisk None       Visual Fields (Counting fingers)      Left Right    Full Full       Extraocular Movement      Right Left    Full, Ortho Full, Ortho       Neuro/Psych    Oriented x3:  Yes   Mood/Affect:  Normal       Dilation    Both eyes:  1.0% Mydriacyl, 2.5% Phenylephrine @ 2:12 PM        Slit Lamp and Fundus Exam    Slit Lamp Exam      Right Left   Lids/Lashes Dermatochalasis - upper lid, mild Telangiectasia Dermatochalasis - upper lid, mild Telangiectasia   Conjunctiva/Sclera White and quiet mild temporal Pinguecula   Cornea 1-2+ Punctate epithelial erosions, Well healed cataract wounds 1-2+ Punctate epithelial erosions, Well healed superior cataract wounds   Anterior Chamber Deep and clear Deep and clear   Iris Round and dilated Round and dilated   Lens toric PC IOL in good position marks at 0330 and 0930, 1+ Posterior capsular opacification toric PC IOL in good position marks at 0230 and 0830, trace Posterior capsular opacification   Vitreous Vitreous syneresis, Posterior vitreous detachment Vitreous syneresis, Posterior vitreous detachment, Weiss ring       Fundus Exam      Right Left   Disc Compact, Pink and Sharp Compact, Pink and Sharp   C/D Ratio 0.1 0.1   Macula Flat, Blunted foveal reflex, No heme or edema Flat, Blunted foveal reflex, trace Epiretinal membrane, Retinal pigment epithelial mottling, No heme or edema   Vessels Vascular attenuation, Tortuous, AV crossing changes Vascular attenuation, Tortuous, AV crossing changes   Periphery Attached, inferior Cobblestoning, peripheral cystoid degeneration Attached, VR tuft at 0600, inferior Cobblestoning, peripheral cystoid  degeneration          IMAGING AND PROCEDURES  Imaging and Procedures for @TODAY @  OCT, Retina - OU - Both Eyes       Right Eye Quality was good. Central Foveal Thickness: 301. Progression has no prior data. Findings include normal foveal contour, no  SRF, no IRF (Trace ERM).   Left Eye Quality was good. Central Foveal Thickness: 312. Progression has no prior data. Findings include normal foveal contour, no SRF, no IRF (Trace ERM, mild vitreous opacities).   Notes *Images captured and stored on drive  Diagnosis / Impression:  NFP, No IRF/SRF, trace ERM OU    Clinical management:  See below  Abbreviations: NFP - Normal foveal profile. CME - cystoid macular edema. PED - pigment epithelial detachment. IRF - intraretinal fluid. SRF - subretinal fluid. EZ - ellipsoid zone. ERM - epiretinal membrane. ORA - outer retinal atrophy. ORT - outer retinal tubulation. SRHM - subretinal hyper-reflective material                 ASSESSMENT/PLAN:    ICD-10-CM   1. Posterior vitreous detachment of both eyes H43.813   2. Pseudophakia of both eyes Z96.1   3. Retinal edema H35.81 OCT, Retina - OU - Both Eyes    1. PVD / vitreous syneresis OU  - OS with acute Weiss ring / prominent PVD x 1d  - Discussed findings and prognosis  - No RT or RD on 360 scleral depressed exam  - Reviewed s/s of RT/RD  - Strict return precautions for any such RT/RD signs/symptoms  - f/u 3-4 wks, sooner prn  2. Pseudophakia OU  - s/p CE / toric IOL OU w/ Dr. Lucita Ferrara   OD 6.25.19   OS 6.18.19  - beautiful surgeries, doing well  - history of post op CME, s/p retrobulbar kenalog injection OD w/ Zigmund Daniel 8.7.19 -- resolved  3. No retinal edema on exam or OCT  Ophthalmic Meds Ordered this visit:  No orders of the defined types were placed in this encounter.      Return for F/U 3-4 weeks floater OS, DFE, OCT.  There are no Patient Instructions on file for this visit.   Explained the  diagnoses, plan, and follow up with the patient and they expressed understanding.  Patient expressed understanding of the importance of proper follow up care.   This document serves as a record of services personally performed by Gardiner Sleeper, MD, PhD. It was created on their behalf by Ernest Mallick, OA, an ophthalmic assistant. The creation of this record is the provider's dictation and/or activities during the visit.    Electronically signed by: Ernest Mallick, OA  11.25.19 11:06 PM    Gardiner Sleeper, M.D., Ph.D. Diseases & Surgery of the Retina and Vitreous [facility]   I have reviewed the above documentation for accuracy and completeness, and I agree with the above. Gardiner Sleeper, M.D., Ph.D. 02/13/18 11:10 PM   Abbreviations: M myopia (nearsighted); A astigmatism; H hyperopia (farsighted); P presbyopia; Mrx spectacle prescription;  CTL contact lenses; OD right eye; OS left eye; OU both eyes  XT exotropia; ET esotropia; PEK punctate epithelial keratitis; PEE punctate epithelial erosions; DES dry eye syndrome; MGD meibomian gland dysfunction; ATs artificial tears; PFAT's preservative free artificial tears; [place] nuclear sclerotic cataract; PSC posterior subcapsular cataract; ERM epi-retinal membrane; PVD posterior vitreous detachment; RD retinal detachment; DM diabetes mellitus; DR diabetic retinopathy; NPDR non-proliferative diabetic retinopathy; PDR proliferative diabetic retinopathy; CSME clinically significant macular edema; DME diabetic macular edema; dbh dot blot hemorrhages; CWS cotton wool spot; POAG primary open angle glaucoma; C/D cup-to-disc ratio; HVF humphrey visual field; GVF goldmann visual field; OCT optical coherence tomography; IOP intraocular pressure; BRVO Branch retinal vein occlusion; CRVO central retinal vein occlusion; CRAO central retinal  artery occlusion; BRAO branch retinal artery occlusion; RT retinal tear; SB scleral buckle; PPV pars plana  vitrectomy; VH Vitreous hemorrhage; PRP panretinal laser photocoagulation; IVK intravitreal kenalog; VMT vitreomacular traction; MH Macular hole;  NVD neovascularization of the disc; NVE neovascularization elsewhere; AREDS age related eye disease study; ARMD age related macular degeneration; POAG primary open angle glaucoma; EBMD epithelial/anterior basement membrane dystrophy; ACIOL anterior chamber intraocular lens; IOL intraocular lens; PCIOL posterior chamber intraocular lens; Phaco/IOL phacoemulsification with intraocular lens placement; [place] photorefractive keratectomy; LASIK laser assisted in situ keratomileusis; HTN hypertension; DM diabetes mellitus; COPD chronic obstructive pulmonary disease

## 2018-02-14 ENCOUNTER — Encounter (INDEPENDENT_AMBULATORY_CARE_PROVIDER_SITE_OTHER): Payer: Medicare Other | Admitting: Ophthalmology

## 2018-03-09 NOTE — Progress Notes (Signed)
[facility] Note  03/13/2018     CHIEF COMPLAINT Patient presents for Retina Follow Up   HISTORY OF PRESENT ILLNESS: Donald Rose is a 72 y.o. male who presents to the clinic today for:   HPI    Retina Follow Up    Patient presents with  PVD.  In both eyes.  This started 4 weeks ago.  Severity is moderate.  Duration of 4 weeks.  Since onset it is stable.  I, the attending physician,  performed the HPI with the patient and updated documentation appropriately.          Comments    72 y/o male pt here for 4 wk f/u for PVD OU.  No change in [place] OU.  Denies pain, flashes, but has a few small floaters OU.  ATs prn OU.       Last edited by Bernarda Caffey, MD on 03/13/2018  3:02 PM. (History)    pt states he is not having any new flashes, he states his floaters are still there, but getting better, he states Dr. Zigmund Daniel had released him back to Dr. Lucita Ferrara before this last incident   Referring physician: Vevelyn Royals, MD No address on file  HISTORICAL INFORMATION:   Selected notes from the MEDICAL RECORD NUMBER Drs. Zigmund Daniel and [place] pt here for increase in floaters OS x1 day LEE: 10.23.19 [place]) Ocular Hx- PMH-    CURRENT MEDICATIONS: Current Outpatient Medications (Ophthalmic Drugs)  Medication Sig  . Bromfenac Sodium (PROLENSA) 0.07 % SOLN PUT 1 DROP INTO LEFT EYE AT BEDTIME  . Difluprednate (DUREZOL) 0.05 % EMUL INSTILL 1 DROP INTO LEFT EYE 3 TIMES A DAY   No current facility-administered medications for this visit.  (Ophthalmic Drugs)   Current Outpatient Medications (Other)  Medication Sig  . bisacodyl (DULCOLAX) 5 MG EC tablet Take 5 mg by mouth once. X 4 for colon prep 10-16  . polyethylene glycol powder (MIRALAX) powder Take 1 Container by mouth once. 238 grams for colon prep 10-16  . vitamin B-12 (CYANOCOBALAMIN) 100 MCG tablet Vitamin B12   No current facility-administered medications for this visit.  (Other)       REVIEW OF SYSTEMS: ROS    Positive for: Eyes   Negative for: Constitutional, Gastrointestinal, Neurological, Skin, Genitourinary, Musculoskeletal, HENT, Endocrine, Cardiovascular, Respiratory, Psychiatric, Allergic/Imm, Heme/Lymph   Last edited by Matthew Folks, COA on 03/13/2018  1:49 PM. (History)       ALLERGIES Allergies  Allergen Reactions  . Other     Mercurochrome     PAST MEDICAL HISTORY Past Medical History:  Diagnosis Date  . Arthritis   . 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 Cataract    removed bilat   . Hyperlipidemia   . Neuromuscular disorder ([place])    undetermined at this time    Past Surgical History:  Procedure Laterality Date  . CATARACT EXTRACTION Bilateral   . CATARACT EXTRACTION, BILATERAL    . DENTAL SURGERY      FAMILY HISTORY Family History  Problem Relation Age of Onset  . Arthritis Mother   . Cancer Mother   . Cancer Father   . Early death Son   . Arthritis Maternal Grandmother   . Arthritis Paternal Grandmother   . Colon polyps Neg Hx   . Colon cancer Neg Hx   . Esophageal cancer Neg Hx   . Rectal cancer Neg Hx   . Stomach cancer Neg Hx  SOCIAL HISTORY Social History   Tobacco Use  . Smoking status: Never Smoker  . Smokeless tobacco: Never Used  Substance Use Topics  . Alcohol use: Yes    Comment: occasionally   . Drug use: No         OPHTHALMIC EXAM:  Base Eye Exam    Visual Acuity (Snellen - Linear)      Right Left   Dist [place] 20/30 -2 20/40 -   Dist ph [place] 20/25 + 20/25 -2       Tonometry (Tonopen, 1:48 PM)      Right Left   Pressure 12 14  Squeezing       Pupils      Dark Light Shape React APD   Right 4 3 Round Brisk None   Left 4 3 Round Brisk None       Visual Fields (Counting fingers)      Left Right    Full Full       Extraocular Movement      Right Left    Full, Ortho Full, Ortho       Neuro/Psych    Oriented x3:  Yes   Mood/Affect:  Normal       Dilation    Both  eyes:  2.5% Phenylephrine @ 1:48 PM        Slit Lamp and Fundus Exam    Slit Lamp Exam      Right Left   Lids/Lashes Dermatochalasis - upper lid, mild Telangiectasia Dermatochalasis - upper lid, mild Telangiectasia   Conjunctiva/Sclera White and quiet mild temporal Pinguecula   Cornea 1-2+ Punctate epithelial erosions, Well healed cataract wounds, Debris in tear film 1-2+ Punctate epithelial erosions, Well healed superior cataract wounds, Debris in tear film   Anterior Chamber Deep and clear Deep and clear   Iris Round and dilated Round and dilated   Lens toric PC IOL in good position marks at 0330 and 0930, 1+ Posterior capsular opacification toric PC IOL in good position marks at 0230 and 0830, trace Posterior capsular opacification   Vitreous Vitreous syneresis, Posterior vitreous detachment Vitreous syneresis, Posterior vitreous detachment, Weiss ring       Fundus Exam      Right Left   Disc Compact, Pink and Sharp Compact, Pink and Sharp   C/D Ratio 0.1 0.1   Macula Flat, Blunted foveal reflex, Retinal pigment epithelial mottling, Epiretinal membrane, No heme or edema Flat, Blunted foveal reflex, trace Epiretinal membrane, Retinal pigment epithelial mottling, No heme or edema   Vessels Vascular attenuation, Tortuous, AV crossing changes Vascular attenuation, Tortuous, AV crossing changes   Periphery Attached, inferior pigmented Cobblestoning, peripheral cystoid degeneration Attached, VR tuft at 0600, inferior Cobblestoning, peripheral cystoid degeneration          IMAGING AND PROCEDURES  Imaging and Procedures for @TODAY @  OCT, Retina - OU - Both Eyes       Right Eye Quality was good. Central Foveal Thickness: 304. Progression has no prior data. Findings include normal foveal contour, no SRF, no IRF (Trace ERM).   Left Eye Quality was good. Central Foveal Thickness: 309. Progression has no prior data. Findings include normal foveal contour, no SRF, no IRF (Trace ERM, mild  vitreous opacities -- improved, trace cystic change nasal fovea).   Notes *Images captured and stored on drive  Diagnosis / Impression:  NFP, No IRF/SRF, trace ERM OU    Clinical management:  See below  Abbreviations: NFP - Normal foveal profile. CME -  cystoid macular edema. PED - pigment epithelial detachment. IRF - intraretinal fluid. SRF - subretinal fluid. EZ - ellipsoid zone. ERM - epiretinal membrane. ORA - outer retinal atrophy. ORT - outer retinal tubulation. SRHM - subretinal hyper-reflective material                 ASSESSMENT/PLAN:    ICD-10-CM   1. Posterior vitreous detachment of both eyes H43.813   2. Pseudophakia of both eyes Z96.1   3. Retinal edema H35.81 OCT, Retina - OU - Both Eyes    1. PVD / vitreous syneresis OU  - OS with acute Weiss ring / prominent PVD   - symptoms improved  - Discussed findings and prognosis  - No RT or RD on 360 scleral depressed exam  - Reviewed s/s of RT/RD  - Strict return precautions for any such RT/RD signs/symptoms  - f/u prn  2. Pseudophakia OU  - s/p CE / toric IOL OU w/ Dr. Lucita Ferrara   OD 6.25.19   OS 6.18.19  - beautiful surgeries, doing well  - history of post op CME, s/p retrobulbar kenalog injection OD w/ Zigmund Daniel 8.7.19 -- resolved  3. No retinal edema on exam or OCT  Ophthalmic Meds Ordered this visit:  No orders of the defined types were placed in this encounter.      Return if symptoms worsen or fail to improve.  There are no Patient Instructions on file for this visit.   Explained the diagnoses, plan, and follow up with the patient and they expressed understanding.  Patient expressed understanding of the importance of proper follow up care.   This document serves as a record of services personally performed by Gardiner Sleeper, MD, PhD. It was created on their behalf by Ernest Mallick, OA, an ophthalmic assistant. The creation of this record is the provider's dictation and/or activities during  the visit.    Electronically signed by: Ernest Mallick, OA  12.19.19 3:02 PM    Gardiner Sleeper, M.D., Ph.D. Diseases & Surgery of the Retina and Vitreous [facility]  I have reviewed the above documentation for accuracy and completeness, and I agree with the above. Gardiner Sleeper, M.D., Ph.D. 03/13/18 3:05 PM     Abbreviations: M myopia (nearsighted); A astigmatism; H hyperopia (farsighted); P presbyopia; Mrx spectacle prescription;  CTL contact lenses; OD right eye; OS left eye; OU both eyes  XT exotropia; ET esotropia; PEK punctate epithelial keratitis; PEE punctate epithelial erosions; DES dry eye syndrome; MGD meibomian gland dysfunction; ATs artificial tears; PFAT's preservative free artificial tears; [place] nuclear sclerotic cataract; PSC posterior subcapsular cataract; ERM epi-retinal membrane; PVD posterior vitreous detachment; RD retinal detachment; DM diabetes mellitus; DR diabetic retinopathy; NPDR non-proliferative diabetic retinopathy; PDR proliferative diabetic retinopathy; CSME clinically significant macular edema; DME diabetic macular edema; dbh dot blot hemorrhages; CWS cotton wool spot; POAG primary open angle glaucoma; C/D cup-to-disc ratio; HVF humphrey visual field; GVF goldmann visual field; OCT optical coherence tomography; IOP intraocular pressure; BRVO Branch retinal vein occlusion; CRVO central retinal vein occlusion; CRAO central retinal artery occlusion; BRAO branch retinal artery occlusion; RT retinal tear; SB scleral buckle; PPV pars plana vitrectomy; VH Vitreous hemorrhage; PRP panretinal laser photocoagulation; IVK intravitreal kenalog; VMT vitreomacular traction; MH Macular hole;  NVD neovascularization of the disc; NVE neovascularization elsewhere; AREDS age related eye disease study; ARMD age related macular degeneration; POAG primary open angle glaucoma; EBMD epithelial/anterior basement membrane dystrophy; ACIOL anterior chamber intraocular  lens; IOL intraocular lens;  PCIOL posterior chamber intraocular lens; Phaco/IOL phacoemulsification with intraocular lens placement; [place] photorefractive keratectomy; LASIK laser assisted in situ keratomileusis; HTN hypertension; DM diabetes mellitus; COPD chronic obstructive pulmonary disease

## 2018-03-13 ENCOUNTER — Encounter (INDEPENDENT_AMBULATORY_CARE_PROVIDER_SITE_OTHER): Payer: Self-pay | Admitting: Ophthalmology

## 2018-03-13 ENCOUNTER — Ambulatory Visit (INDEPENDENT_AMBULATORY_CARE_PROVIDER_SITE_OTHER): Payer: Medicare Other | Admitting: Ophthalmology

## 2018-03-13 DIAGNOSIS — Z961 Presence of intraocular lens: Secondary | ICD-10-CM

## 2018-03-13 DIAGNOSIS — H43813 Vitreous degeneration, bilateral: Secondary | ICD-10-CM

## 2018-03-13 DIAGNOSIS — H3581 Retinal edema: Secondary | ICD-10-CM

## 2018-05-04 ENCOUNTER — Other Ambulatory Visit: Payer: Self-pay | Admitting: Orthopedic Surgery

## 2018-05-18 ENCOUNTER — Other Ambulatory Visit: Payer: Self-pay

## 2018-05-18 ENCOUNTER — Encounter (HOSPITAL_BASED_OUTPATIENT_CLINIC_OR_DEPARTMENT_OTHER): Payer: Self-pay | Admitting: *Deleted

## 2018-05-30 ENCOUNTER — Ambulatory Visit (HOSPITAL_BASED_OUTPATIENT_CLINIC_OR_DEPARTMENT_OTHER)
Admission: RE | Admit: 2018-05-30 | Discharge: 2018-05-30 | Disposition: A | Discharge status: Home / Self Care | Payer: Medicare Other | Attending: Orthopedic Surgery | Admitting: Orthopedic Surgery

## 2018-05-30 ENCOUNTER — Encounter (HOSPITAL_BASED_OUTPATIENT_CLINIC_OR_DEPARTMENT_OTHER)
Admission: RE | Disposition: A | Discharge status: Home / Self Care | Payer: Self-pay | Source: Home / Self Care | Attending: Orthopedic Surgery

## 2018-05-30 ENCOUNTER — Ambulatory Visit (HOSPITAL_BASED_OUTPATIENT_CLINIC_OR_DEPARTMENT_OTHER): Payer: Medicare Other | Admitting: Anesthesiology

## 2018-05-30 ENCOUNTER — Encounter (HOSPITAL_BASED_OUTPATIENT_CLINIC_OR_DEPARTMENT_OTHER): Payer: Self-pay

## 2018-05-30 ENCOUNTER — Other Ambulatory Visit: Payer: Self-pay

## 2018-05-30 DIAGNOSIS — Z9841 Cataract extraction status, right eye: Secondary | ICD-10-CM | POA: Diagnosis not present

## 2018-05-30 DIAGNOSIS — E785 Hyperlipidemia, unspecified: Secondary | ICD-10-CM | POA: Diagnosis not present

## 2018-05-30 DIAGNOSIS — Z85828 Personal history of other malignant neoplasm of skin: Secondary | ICD-10-CM | POA: Diagnosis not present

## 2018-05-30 DIAGNOSIS — Z8261 Family history of arthritis: Secondary | ICD-10-CM | POA: Diagnosis not present

## 2018-05-30 DIAGNOSIS — M1990 Unspecified osteoarthritis, unspecified site: Secondary | ICD-10-CM | POA: Insufficient documentation

## 2018-05-30 DIAGNOSIS — Z9842 Cataract extraction status, left eye: Secondary | ICD-10-CM | POA: Diagnosis not present

## 2018-05-30 DIAGNOSIS — G5603 Carpal tunnel syndrome, bilateral upper limbs: Secondary | ICD-10-CM | POA: Insufficient documentation

## 2018-05-30 DIAGNOSIS — Z809 Family history of malignant neoplasm, unspecified: Secondary | ICD-10-CM | POA: Diagnosis not present

## 2018-05-30 HISTORY — PX: CARPAL TUNNEL RELEASE: SHX101

## 2018-05-30 SURGERY — CARPAL TUNNEL RELEASE
Anesthesia: Monitor Anesthesia Care | Site: Wrist | Laterality: Left

## 2018-05-30 MED ORDER — PROPOFOL 10 MG/ML IV BOLUS
INTRAVENOUS | Status: AC
  Filled 2018-05-30: qty 20

## 2018-05-30 MED ORDER — OXYCODONE HCL 5 MG/5ML PO SOLN: 5.0000 mg | Freq: Once | ORAL | Status: DC | PRN

## 2018-05-30 MED ORDER — OXYCODONE HCL 5 MG PO TABS: 5.0000 mg | ORAL_TABLET | Freq: Once | ORAL | Status: DC | PRN

## 2018-05-30 MED ORDER — CEFAZOLIN SODIUM-DEXTROSE 2-4 GM/100ML-% IV SOLN
2.0000 g | INTRAVENOUS | Status: AC
  Administered 2018-05-30: 2 g via INTRAVENOUS

## 2018-05-30 MED ORDER — ONDANSETRON HCL 4 MG/2ML IJ SOLN
INTRAMUSCULAR | Status: DC | PRN
  Administered 2018-05-30: 4 mg via INTRAVENOUS

## 2018-05-30 MED ORDER — BUPIVACAINE HCL (PF) 0.25 % IJ SOLN
INTRAMUSCULAR | Status: DC | PRN
  Administered 2018-05-30: 7 mL

## 2018-05-30 MED ORDER — MIDAZOLAM HCL 5 MG/5ML IJ SOLN
INTRAMUSCULAR | Status: DC | PRN
  Administered 2018-05-30: 1 mg via INTRAVENOUS

## 2018-05-30 MED ORDER — FENTANYL CITRATE (PF) 100 MCG/2ML IJ SOLN
INTRAMUSCULAR | Status: DC | PRN
  Administered 2018-05-30: 50 ug via INTRAVENOUS

## 2018-05-30 MED ORDER — ACETAMINOPHEN 500 MG PO TABS
1000.0000 mg | ORAL_TABLET | Freq: Once | ORAL | Status: AC | PRN
  Administered 2018-05-30: 1000 mg via ORAL
  Filled 2018-05-30: qty 2

## 2018-05-30 MED ORDER — ACETAMINOPHEN 160 MG/5ML PO SOLN: 1000.0000 mg | Freq: Once | ORAL | Status: AC | PRN

## 2018-05-30 MED ORDER — MIDAZOLAM HCL 2 MG/2ML IJ SOLN
INTRAMUSCULAR | Status: AC
  Filled 2018-05-30: qty 2

## 2018-05-30 MED ORDER — PROPOFOL 500 MG/50ML IV EMUL
INTRAVENOUS | Status: DC | PRN
  Administered 2018-05-30: 75 ug/kg/min via INTRAVENOUS

## 2018-05-30 MED ORDER — ONDANSETRON HCL 4 MG/2ML IJ SOLN
INTRAMUSCULAR | Status: AC
  Filled 2018-05-30: qty 2

## 2018-05-30 MED ORDER — BUPIVACAINE HCL (PF) 0.25 % IJ SOLN
INTRAMUSCULAR | Status: AC
  Filled 2018-05-30: qty 30

## 2018-05-30 MED ORDER — CEFAZOLIN SODIUM-DEXTROSE 2-4 GM/100ML-% IV SOLN
INTRAVENOUS | Status: AC
  Filled 2018-05-30: qty 100

## 2018-05-30 MED ORDER — TRAMADOL HCL 50 MG PO TABS: 50.0000 mg | ORAL_TABLET | Freq: Four times a day (QID) | ORAL | 0 refills | Status: DC | PRN

## 2018-05-30 MED ORDER — ACETAMINOPHEN 10 MG/ML IV SOLN: 1000.0000 mg | Freq: Once | INTRAVENOUS | Status: DC | PRN

## 2018-05-30 MED ORDER — CHLORHEXIDINE GLUCONATE 4 % EX LIQD: 60.0000 mL | Freq: Once | CUTANEOUS | Status: DC

## 2018-05-30 MED ORDER — FENTANYL CITRATE (PF) 100 MCG/2ML IJ SOLN
INTRAMUSCULAR | Status: AC
  Filled 2018-05-30: qty 2

## 2018-05-30 MED ORDER — FENTANYL CITRATE (PF) 100 MCG/2ML IJ SOLN: 25.0000 ug | INTRAMUSCULAR | Status: DC | PRN

## 2018-05-30 MED ORDER — LACTATED RINGERS IV SOLN
INTRAVENOUS | Status: DC
  Administered 2018-05-30: 09:00:00 via INTRAVENOUS

## 2018-05-30 SURGICAL SUPPLY — 36 items
BLADE SURG 15 STRL LF DISP TIS (BLADE) ×1 IMPLANT
BLADE SURG 15 STRL SS (BLADE) ×2
BNDG COHESIVE 3X5 TAN STRL LF (GAUZE/BANDAGES/DRESSINGS) ×3 IMPLANT
BNDG ESMARK 4X9 LF (GAUZE/BANDAGES/DRESSINGS) IMPLANT
BNDG GAUZE ELAST 4 BULKY (GAUZE/BANDAGES/DRESSINGS) ×3 IMPLANT
CHLORAPREP W/TINT 26 (MISCELLANEOUS) ×3 IMPLANT
CORD BIPOLAR FORCEPS 12FT (ELECTRODE) ×3 IMPLANT
COVER BACK TABLE 60X90IN (DRAPES) ×3 IMPLANT
COVER MAYO STAND STRL (DRAPES) ×3 IMPLANT
COVER WAND RF STERILE (DRAPES) IMPLANT
CUFF TOURNIQUET SINGLE 18IN (TOURNIQUET CUFF) ×3 IMPLANT
DRAPE EXTREMITY T 121X128X90 (DISPOSABLE) ×3 IMPLANT
DRAPE SURG 17X23 STRL (DRAPES) ×3 IMPLANT
DRSG PAD ABDOMINAL 8X10 ST (GAUZE/BANDAGES/DRESSINGS) ×3 IMPLANT
GAUZE SPONGE 4X4 12PLY STRL (GAUZE/BANDAGES/DRESSINGS) ×3 IMPLANT
GAUZE XEROFORM 1X8 LF (GAUZE/BANDAGES/DRESSINGS) ×3 IMPLANT
GLOVE BIO SURGEON STRL SZ 6.5 (GLOVE) ×2 IMPLANT
GLOVE BIO SURGEONS STRL SZ 6.5 (GLOVE) ×1
GLOVE BIOGEL PI IND STRL 7.0 (GLOVE) ×2 IMPLANT
GLOVE BIOGEL PI IND STRL 8.5 (GLOVE) ×1 IMPLANT
GLOVE BIOGEL PI INDICATOR 7.0 (GLOVE) ×4
GLOVE BIOGEL PI INDICATOR 8.5 (GLOVE) ×2
GLOVE SURG ORTHO 8.0 STRL STRW (GLOVE) ×3 IMPLANT
GOWN STRL REUS W/ TWL LRG LVL3 (GOWN DISPOSABLE) ×1 IMPLANT
GOWN STRL REUS W/TWL LRG LVL3 (GOWN DISPOSABLE) ×2
GOWN STRL REUS W/TWL XL LVL3 (GOWN DISPOSABLE) ×3 IMPLANT
NEEDLE PRECISIONGLIDE 27X1.5 (NEEDLE) IMPLANT
NS IRRIG 1000ML POUR BTL (IV SOLUTION) ×3 IMPLANT
PACK BASIN DAY SURGERY FS (CUSTOM PROCEDURE TRAY) ×3 IMPLANT
STOCKINETTE 4X48 STRL (DRAPES) ×3 IMPLANT
SUT ETHILON 4 0 PS 2 18 (SUTURE) ×3 IMPLANT
SUT VICRYL 4-0 PS2 18IN ABS (SUTURE) IMPLANT
SYR BULB 3OZ (MISCELLANEOUS) ×3 IMPLANT
SYR CONTROL 10ML LL (SYRINGE) IMPLANT
TOWEL GREEN STERILE FF (TOWEL DISPOSABLE) ×3 IMPLANT
UNDERPAD 30X30 (UNDERPADS AND DIAPERS) ×3 IMPLANT

## 2018-05-30 NOTE — Anesthesia Postprocedure Evaluation (Signed)
Anesthesia Post Note  Patient: Mertha Finders  Procedure(s) Performed: LEFT CARPAL TUNNEL RELEASE (Left Wrist)     Patient location during evaluation: PACU Anesthesia Type: MAC and Bier Block Level of consciousness: awake and alert Pain management: pain level controlled Vital Signs Assessment: post-procedure vital signs reviewed and stable Respiratory status: spontaneous breathing, nonlabored ventilation, respiratory function stable and patient connected to nasal cannula oxygen Cardiovascular status: stable and blood pressure returned to baseline Postop Assessment: no apparent nausea or vomiting Anesthetic complications: no    Last Vitals:  Vitals:   05/30/18 0930 05/30/18 0950  BP: (!) 144/81 138/90  Pulse: (!) 58 (!) 57  Resp: 12 16  Temp:  36.6 C  SpO2: 97% 97%    Last Pain:  Vitals:   05/30/18 1015  TempSrc:   PainSc: 3                  Tabrina Esty

## 2018-05-30 NOTE — Transfer of Care (Signed)
Immediate Anesthesia Transfer of Care Note  Patient: Donald Rose  Procedure(s) Performed: LEFT CARPAL TUNNEL RELEASE (Left Wrist)  Patient Location: PACU  Anesthesia Type:Bier block  Level of Consciousness: awake, alert  and oriented  Airway & Oxygen Therapy: Patient Spontanous Breathing  Post-op Assessment: Report given to RN and Post -op Vital signs reviewed and stable  Post vital signs: Reviewed and stable  Last Vitals:  Vitals Value Taken Time  BP 139/73 05/30/2018  9:12 AM  Temp    Pulse 64 05/30/2018  9:14 AM  Resp 9 05/30/2018  9:14 AM  SpO2 98 % 05/30/2018  9:14 AM  Vitals shown include unvalidated device data.  Last Pain:  Vitals:   05/30/18 0727  TempSrc: Oral  PainSc: 0-No pain         Complications: No apparent anesthesia complications

## 2018-05-30 NOTE — Op Note (Signed)
NAME: Donald Rose MEDICAL RECORD NO: [id number] DATE OF BIRTH: 05-Apr-1945 FAC[facility] LOCATION: [facility] PHYSICIAN: Wynonia Sours, MD   OPERATIVE REPORT   DATE OF PROCEDURE: 05/30/18    PREOPERATIVE DIAGNOSIS:   Carpal tunnel syndrome left hand   POSTOPERATIVE DIAGNOSIS:   Same  PROCEDURE:   Decompression median nerve left hand   SURGEON: Daryll Brod, M.D.   ASSISTANT: none   ANESTHESIA:  Bier block with sedation and Local   INTRAVENOUS FLUIDS:  Per anesthesia flow sheet.   ESTIMATED BLOOD LOSS:  Minimal.   COMPLICATIONS:  None.   SPECIMENS:  none   TOURNIQUET TIME:    Total Tourniquet Time Documented: Forearm (Left) - 27 minutes Total: Forearm (Left) - 27 minutes    DISPOSITION:  Stable to PACU.   INDICATIONS: Patient is a 73 year old male with a history of carpal tunnel syndrome nerve conduction is positive not responsive to conservative treatment.  He is elected undergo surgical decompression of the median nerve left hand.  Pre-peri-and postoperative course been discussed along with risks and complications.  He is aware that there is no guarantee to the surgery the possibility of infection recurrence injury to arteries nerves tendons complete relief symptoms dystrophy.  In preoperative area the patient is seen the extremity marked by both patient and surgeon antibiotic given  OPERATIVE COURSE: Patient is brought to the operating room where form based IV regional anesthetic was carried out without difficulty under the direction of the anesthesia department.  He was prepped using ChloraPrep in the supine position with left arm free.  A three-minute dry time was allowed timeout taken confirming patient procedure.  A longitudinal incision was made left palm carried down through subcutaneous tissue.  Bleeders were electrocauterized with bipolar.  The palmar fascia was split.  Superficial palmar arch was identified.  Flexor tendon of the ring little  finger was identified.  Retractors were placed retracting median nerve flexor tendons radial ulnar nerve ulnarly.  The flexor retinaculum was then released on its ulnar border.  Right angle and stool retractor placed between skin and forearm fascia the deep structures dissected free and the forearm fascia then released for approximately 2 cm proximal to the wrist crease under direct vision.  The canal was explored.  Area compression to the nerve was apparent.  Motor branch entered the muscle distally.  No further lesions were identified.  Wound was copiously irrigated with saline.  The skin was closed interrupted 4 nylon sutures.  Sterile compressive dressing with the fingers free was applied.  Deflation the tourniquet all fingers immediately pink.  With a local infiltration of 8 cc of bupivacaine was given upon closure of the wound.  Patient tolerated the procedure well was taken to the recovery room for observation in satisfactory condition.  He will be discharged home to return to the hand center [facility] in 1 week on Tylenol ibuprofen with Ultram as a backup.   Daryll Brod, MD Electronically signed, 05/30/18

## 2018-05-30 NOTE — Brief Op Note (Signed)
05/30/2018  9:14 AM  PATIENT:  Mertha Finders  73 y.o. male  PRE-OPERATIVE DIAGNOSIS:  LEFT CARPAL TUNNEL SYNDROME  POST-OPERATIVE DIAGNOSIS:  LEFT CARPAL TUNNEL SYNDROME  PROCEDURE:  Procedure(s): LEFT CARPAL TUNNEL RELEASE (Left)  SURGEON:  Surgeon(s) and Role:    * Daryll Brod, MD - Primary  PHYSICIAN ASSISTANT:   ASSISTANTS: none   ANESTHESIA:   local, regional and IV sedation  EBL: 72ml  BLOOD ADMINISTERED:none  DRAINS: none   LOCAL MEDICATIONS USED:  BUPIVICAINE   SPECIMEN:  No Specimen  DISPOSITION OF SPECIMEN:  N/A  COUNTS:  YES  TOURNIQUET:   Total Tourniquet Time Documented: Forearm (Left) - 27 minutes Total: Forearm (Left) - 27 minutes   DICTATION: .Viviann Spare Dictation  PLAN OF CARE: Discharge to home after PACU  PATIENT DISPOSITION:  PACU - hemodynamically stable.

## 2018-05-30 NOTE — H&P (Signed)
  Donald Rose is an 73 y.o. male.   Chief Complaint: numbness left handHPI: Donald Rose is a 73 year old right-hand-dominant male referred by Dr. Sharlett Iles for consultation regarding numbness in his left arm. Shoulder discomfort. Crease dexterity. He is not complaining of pain. This been going on for a year. He recalls no history of injury to the hand or to the neck although he does see a Restaurant manager, fast food. He is complaining of low back pain and leg pain. He states massage of his shoulder has helped. 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 He localizes discomfort in his shoulder over the deltoid area. States that all fingers feel to get numb. He is not awakened at night. Does have an inversion device. He has no history of diabetes thyroid problems arthritis or gout. Family history is positive arthritis negative for diabetes thyroid problems and goutHis nerve conductions done revealing bilateral carpal tunnel     Past Medical History:  Diagnosis Date  . Arthritis   . 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 Cataract    removed bilat   . Hyperlipidemia   . Neuromuscular disorder ([place])    undetermined at this time     Past Surgical History:  Procedure Laterality Date  . CATARACT EXTRACTION Bilateral   . CATARACT EXTRACTION, BILATERAL    . DENTAL SURGERY      Family History  Problem Relation Age of Onset  . Arthritis Mother   . Cancer Mother   . Cancer Father   . Early death Son   . Arthritis Maternal Grandmother   . Arthritis Paternal Grandmother   . Colon polyps Neg Hx   . Colon cancer Neg Hx   . Esophageal cancer Neg Hx   . Rectal cancer Neg Hx   . Stomach cancer Neg Hx    Social History:  reports that he has never smoked. He has never used smokeless tobacco. He reports current alcohol use. He reports that he does not use drugs.  Allergies:  Allergies  Allergen Reactions  . Other     Mercurochrome     No  medications prior to admission.    No results found for this or any previous visit (from the past 48 hour(s)).  No results found.   Pertinent items are noted in HPI.  Height 5\' 10"  (1.778 m), weight 90.7 kg.  General appearance: alert, cooperative and appears stated age Head: Normocephalic, without obvious abnormality Neck: no JVD Resp: clear to auscultation bilaterally Cardio: regular rate and rhythm, S1, S2 normal, no murmur, click, rub or gallop GI: soft, non-tender; bowel sounds normal; no masses,  no organomegaly Extremities: numbness left hand Pulses: 2+ and symmetric Skin: Skin color, texture, turgor normal. No rashes or lesions Neurologic: Grossly normal Incision/Wound: na  Assessment/Plan Assessment:  1. Bilateral carpal tunnel syndrome    Plan: Discussed possibility of surgical intervention with him. Pre-peri-and postoperative course been discussed along with risk complications. He is aware there is no guarantee to the surgery the possibility of infection recurrence injury to arteries nerves tendons complete relief symptoms dystrophy. He would like to think this over and call back possibly schedule. See him back following surgical intervention should he elect to proceed with that. Would be to his left carpal tunnel.   Donald Rose 05/30/2018, 4:24 AM

## 2018-05-30 NOTE — Anesthesia Preprocedure Evaluation (Addendum)
Anesthesia Evaluation  Patient identified by MRN, date of birth, ID band Patient awake    Reviewed: Allergy & Precautions, NPO status , Patient's Chart, lab work & pertinent test results  History of Anesthesia Complications Negative for: history of anesthetic complications  Airway Mallampati: III  TM Distance: <3 FB Neck ROM: Full    Dental  (+) Teeth Intact   Pulmonary neg pulmonary ROS,    breath sounds clear to auscultation       Cardiovascular negative cardio ROS   Rhythm:Regular     Neuro/Psych negative neurological ROS  negative psych ROS   GI/Hepatic negative GI ROS, Neg liver ROS,   Endo/Other  negative endocrine ROS  Renal/GU negative Renal ROS  negative genitourinary   Musculoskeletal  (+) Arthritis ,   Abdominal   Peds  Hematology negative hematology ROS (+)   Anesthesia Other Findings   Reproductive/Obstetrics                            Anesthesia Physical Anesthesia Plan  ASA: II  Anesthesia Plan: MAC and Bier Block and Bier Block-LIDOCAINE ONLY   Post-op Pain Management:    Induction:   PONV Risk Score and Plan: 1 and Treatment may vary due to age or medical condition and Propofol infusion  Airway Management Planned: Nasal Cannula  Additional Equipment: None  Intra-op Plan:   Post-operative Plan:   Informed Consent: I have reviewed the patients History and Physical, chart, labs and discussed the procedure including the risks, benefits and alternatives for the proposed anesthesia with the patient or authorized representative who has indicated his/her understanding and acceptance.     Dental advisory given  Plan Discussed with: CRNA and Surgeon  Anesthesia Plan Comments:         Anesthesia Quick Evaluation

## 2018-05-30 NOTE — Discharge Instructions (Addendum)

## 2018-05-31 ENCOUNTER — Encounter (HOSPITAL_BASED_OUTPATIENT_CLINIC_OR_DEPARTMENT_OTHER): Payer: Self-pay | Admitting: Orthopedic Surgery

## 2018-11-02 ENCOUNTER — Other Ambulatory Visit: Payer: Self-pay | Admitting: Neurosurgery

## 2018-11-03 ENCOUNTER — Other Ambulatory Visit (HOSPITAL_COMMUNITY)
Admission: RE | Admit: 2018-11-03 | Discharge: 2018-11-03 | Disposition: A | Discharge status: Home / Self Care | Payer: Medicare Other | Source: Ambulatory Visit | Attending: Neurosurgery | Admitting: Neurosurgery

## 2018-11-03 DIAGNOSIS — Z20828 Contact with and (suspected) exposure to other viral communicable diseases: Secondary | ICD-10-CM | POA: Insufficient documentation

## 2018-11-03 DIAGNOSIS — Z01812 Encounter for preprocedural laboratory examination: Secondary | ICD-10-CM | POA: Insufficient documentation

## 2018-11-03 LAB — SARS CORONAVIRUS 2 (TAT 6-24 HRS): SARS Coronavirus 2: NEGATIVE

## 2018-11-06 ENCOUNTER — Other Ambulatory Visit: Payer: Self-pay | Admitting: Neurosurgery

## 2018-11-06 ENCOUNTER — Other Ambulatory Visit: Payer: Self-pay

## 2018-11-06 ENCOUNTER — Encounter (HOSPITAL_COMMUNITY): Payer: Self-pay | Admitting: *Deleted

## 2018-11-06 NOTE — H&P (Signed)
Chief Complaint   Neck pain  HPI   HPI: Donald Rose is a 73 y.o. male with 1-2 year history of left-sided shoulder pain as well as left arm and left leg weakness.  In addition he also noted difficulties with fine motor skills with his left hand.    He was initially seen by Orthopedics who did not feel he had intrinsics shoulder pathology and subsequently an MRI of the cervical spine was ordered.    MRI revealed significant left-sided spondylitic disease particularly at C3-4 with resultant compression of the left hemicord and myelomalacia.   He presents today for surgical decompression and fusion.  He is without any concerns.  There are no active problems to display for this patient.   PMH: Past Medical History:  Diagnosis Date  . Arthritis   . 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 Cataract    removed bilat   . Hyperlipidemia   . Neuromuscular disorder ([place])    undetermined at this time     PSH: Past Surgical History:  Procedure Laterality Date  . CARPAL TUNNEL RELEASE Left 05/30/2018   Procedure: LEFT CARPAL TUNNEL RELEASE;  Surgeon: Daryll Brod, MD;  Location: [facility];  Service: Orthopedics;  Laterality: Left;  . CATARACT EXTRACTION Bilateral   . CATARACT EXTRACTION, BILATERAL    . DENTAL SURGERY      No medications prior to admission.    SH: Social History   Tobacco Use  . Smoking status: Never Smoker  . Smokeless tobacco: Never Used  Substance Use Topics  . Alcohol use: Yes    Comment: occasionally   . Drug use: No    MEDS: Prior to Admission medications   Medication Sig Start Date End Date Taking? Authorizing Provider  B COMPLEX VITAMINS SL Place 1 tablet under the tongue daily as needed (energy).   Yes [provider]  calcium carbonate (TUMS - DOSED IN MG ELEMENTAL CALCIUM) 500 MG chewable tablet Chew 2 tablets by mouth daily as needed for indigestion or heartburn.   Yes [provider]  Carboxymethylcellul-Glycerin  (LUBRICATING EYE DROPS OP) Place 1 drop into both eyes daily as needed (dry eyes).   Yes [provider]  clotrimazole (LOTRIMIN) 1 % cream Apply 1 application topically daily as needed (toe itch).   Yes [provider]  ibuprofen (ADVIL) 200 MG tablet Take 600 mg by mouth daily as needed for headache.   Yes [provider]  Ketotifen Fumarate (ALLERGY EYE DROPS OP) Place 1 drop into both eyes daily as needed (allergies).   Yes [provider]  magnesium oxide (MAG-OX) 400 MG tablet Take 400 mg by mouth daily.   Yes [provider]  naproxen sodium (ALEVE) 220 MG tablet Take 440 mg by mouth daily as needed (general pain).   Yes [provider]  OVER THE COUNTER MEDICATION Place 1 Dose under the tongue daily as needed (leg spsams). CBD oil   Yes [provider]  OVER THE COUNTER MEDICATION Apply 1 application topically daily as needed (shoulder pain). Topical CBD cream   Yes [provider]  pravastatin (PRAVACHOL) 20 MG tablet Take 20 mg by mouth daily.   Yes [provider]  celecoxib (CELEBREX) 200 MG capsule Take 200 mg by mouth daily.    [provider]    ALLERGY: Allergies  Allergen Reactions  . Other     Mercurochrome     Social History   Tobacco Use  .  Smoking status: Never Smoker  . Smokeless tobacco: Never Used  Substance Use Topics  . Alcohol use: Yes    Comment: occasionally      Family History  Problem Relation Age of Onset  . Arthritis Mother   . Cancer Mother   . Cancer Father   . Early death Son   . Arthritis Maternal Grandmother   . Arthritis Paternal Grandmother   . Colon polyps Neg Hx   . Colon cancer Neg Hx   . Esophageal cancer Neg Hx   . Rectal cancer Neg Hx   . Stomach cancer Neg Hx      ROS   ROS  Exam   There were no vitals filed for this visit. General appearance: WDWN, NAD Eyes: No scleral injection Cardiovascular: Regular rate and rhythm without  murmurs, rubs, gallops. No edema or variciosities. Distal pulses normal. Pulmonary: Effort normal, non-labored breathing Musculoskeletal:     Muscle tone upper extremities: Normal    Muscle tone lower extremities: Normal    Motor exam: Upper Extremities Deltoid Bicep Tricep Grip  Right 5/5 5/5 5/5 5/5  Left 5/5 5/5 5/5 4+/5   Lower Extremity IP Quad PF DF EHL  Right 5/5 5/5 5/5 5/5 5/5  Left 4+/5 5/5 5/5 5/5 5/5   Neurological Mental Status:    - Patient is awake, alert, oriented to person, place, month, year, and situation    - Patient is able to give a clear and coherent history.    - No signs of aphasia or neglect Cranial Nerves    - II: Visual Fields are full. PERRL    - III/IV/VI: EOMI without ptosis or diploplia.     - V: Facial sensation is grossly normal    - VII: Facial movement is symmetric.     - VIII: hearing is intact to voice    - X: Uvula elevates symmetrically    - XI: Shoulder shrug is symmetric.    - XII: tongue is midline without atrophy or fasciculations.  Sensory: Sensation grossly intact to LT  Results - Imaging/Labs   No results found for this or any previous visit (from the past 48 hour(s)).  No results found.  IMAGING: MRI of the cervical spine was reviewed.  This demonstrates maintenance of normal cervical lordosis.  Primary finding on the left at C3-4 where there is advanced facet arthropathy with left eccentric disc osteophyte complex and resultant severe foraminal stenosis as well as compression of the left hemi cord.  There is associated myelomalacia involving the dorsal lateral segment of the left hemi cord.  Impression/Plan   73 y.o. 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 We will proceed with anterior cervical decompression and fusion at C3-4.  While in the office risks, benefits and alternatives were discussed.  Patient stated understanding and  wished to proceed.  Ferne Reus, PA-C [facility] [employer]

## 2018-11-07 ENCOUNTER — Ambulatory Visit (HOSPITAL_COMMUNITY): Payer: Medicare Other

## 2018-11-07 ENCOUNTER — Ambulatory Visit (HOSPITAL_COMMUNITY): Payer: Medicare Other | Admitting: Certified Registered Nurse Anesthetist

## 2018-11-07 ENCOUNTER — Encounter (HOSPITAL_COMMUNITY): Payer: Self-pay

## 2018-11-07 ENCOUNTER — Observation Stay (HOSPITAL_COMMUNITY)
Admission: AD | Admit: 2018-11-07 | Discharge: 2018-11-08 | Disposition: A | Discharge status: Home / Self Care | Payer: Medicare Other | Attending: Neurosurgery | Admitting: Neurosurgery

## 2018-11-07 ENCOUNTER — Encounter (HOSPITAL_COMMUNITY)
Admission: AD | Disposition: A | Discharge status: Home / Self Care | Payer: Self-pay | Source: Home / Self Care | Attending: Neurosurgery

## 2018-11-07 ENCOUNTER — Other Ambulatory Visit: Payer: Self-pay

## 2018-11-07 DIAGNOSIS — Z791 Long term (current) use of non-steroidal anti-inflammatories (NSAID): Secondary | ICD-10-CM | POA: Diagnosis not present

## 2018-11-07 DIAGNOSIS — M4802 Spinal stenosis, cervical region: Secondary | ICD-10-CM | POA: Diagnosis not present

## 2018-11-07 DIAGNOSIS — Z85828 Personal history of other malignant neoplasm of skin: Secondary | ICD-10-CM | POA: Diagnosis not present

## 2018-11-07 DIAGNOSIS — G992 Myelopathy in diseases classified elsewhere: Secondary | ICD-10-CM | POA: Diagnosis present

## 2018-11-07 DIAGNOSIS — M4712 Other spondylosis with myelopathy, cervical region: Secondary | ICD-10-CM | POA: Diagnosis not present

## 2018-11-07 DIAGNOSIS — Z79899 Other long term (current) drug therapy: Secondary | ICD-10-CM | POA: Insufficient documentation

## 2018-11-07 DIAGNOSIS — Z419 Encounter for procedure for purposes other than remedying health state, unspecified: Secondary | ICD-10-CM

## 2018-11-07 DIAGNOSIS — E785 Hyperlipidemia, unspecified: Secondary | ICD-10-CM | POA: Insufficient documentation

## 2018-11-07 HISTORY — PX: ANTERIOR CERVICAL DECOMP/DISCECTOMY FUSION: SHX1161

## 2018-11-07 LAB — BASIC METABOLIC PANEL
Anion gap: 9 (ref 5–15)
BUN: 13 mg/dL (ref 8–23)
CO2: 19 mmol/L — ABNORMAL LOW (ref 22–32)
Calcium: 9.2 mg/dL (ref 8.9–10.3)
Chloride: 112 mmol/L — ABNORMAL HIGH (ref 98–111)
Creatinine, Ser: 0.9 mg/dL (ref 0.61–1.24)
GFR calc Af Amer: >60 mL/min (ref >60)
GFR calc non Af Amer: >60 mL/min (ref >60)
Glucose, Bld: 95 mg/dL (ref 70–99)
Potassium: 4.4 mmol/L (ref 3.5–5.1)
Sodium: 140 mmol/L (ref 135–145)

## 2018-11-07 LAB — TYPE AND SCREEN
ABO/RH(D): O NEG
Antibody Screen: NEGATIVE

## 2018-11-07 LAB — CBC
HCT: 47.2 % (ref 39.0–52.0)
Hemoglobin: 15.9 g/dL (ref 13.0–17.0)
MCH: 30.9 pg (ref 26.0–34.0)
MCHC: 33.7 g/dL (ref 30.0–36.0)
MCV: 91.8 fL (ref 80.0–100.0)
Platelets: 196 10*3/uL (ref 150–400)
RBC: 5.14 MIL/uL (ref 4.22–5.81)
RDW: 13.2 % (ref 11.5–15.5)
WBC: 7.2 10*3/uL (ref 4.0–10.5)
nRBC: 0 % (ref 0.0–0.2)

## 2018-11-07 LAB — ABO/RH: ABO/RH(D): O NEG

## 2018-11-07 SURGERY — ANTERIOR CERVICAL DECOMPRESSION/DISCECTOMY FUSION 1 LEVEL
Anesthesia: General

## 2018-11-07 MED ORDER — SENNOSIDES-DOCUSATE SODIUM 8.6-50 MG PO TABS: 1.0000 | ORAL_TABLET | Freq: Every evening | ORAL | Status: DC | PRN

## 2018-11-07 MED ORDER — MENTHOL 3 MG MT LOZG
1.0000 | LOZENGE | OROMUCOSAL | Status: DC | PRN
  Filled 2018-11-07: qty 9

## 2018-11-07 MED ORDER — PROMETHAZINE HCL 25 MG/ML IJ SOLN
6.2500 mg | INTRAMUSCULAR | Status: DC | PRN
  Administered 2018-11-07: 6.25 mg via INTRAVENOUS

## 2018-11-07 MED ORDER — LIDOCAINE-EPINEPHRINE 1 %-1:100000 IJ SOLN
INTRAMUSCULAR | Status: DC | PRN
  Administered 2018-11-07: 4 mL

## 2018-11-07 MED ORDER — ZOLPIDEM TARTRATE 5 MG PO TABS: 5.0000 mg | ORAL_TABLET | Freq: Every evening | ORAL | Status: DC | PRN

## 2018-11-07 MED ORDER — ACETAMINOPHEN 650 MG RE SUPP: 650.0000 mg | RECTAL | Status: DC | PRN

## 2018-11-07 MED ORDER — CARBOXYMETHYLCELLUL-GLYCERIN 0.5-0.9 % OP SOLN: 1.0000 [drp] | Freq: Every day | OPHTHALMIC | Status: DC | PRN

## 2018-11-07 MED ORDER — ONDANSETRON HCL 4 MG PO TABS: 4.0000 mg | ORAL_TABLET | Freq: Four times a day (QID) | ORAL | Status: DC | PRN

## 2018-11-07 MED ORDER — SODIUM CHLORIDE 0.9 % IV SOLN
INTRAVENOUS | Status: DC | PRN
  Administered 2018-11-07: 25 ug/min via INTRAVENOUS

## 2018-11-07 MED ORDER — CEFAZOLIN SODIUM-DEXTROSE 2-4 GM/100ML-% IV SOLN
2.0000 g | INTRAVENOUS | Status: AC
  Administered 2018-11-07: 2 g via INTRAVENOUS

## 2018-11-07 MED ORDER — FENTANYL CITRATE (PF) 250 MCG/5ML IJ SOLN
INTRAMUSCULAR | Status: AC
  Filled 2018-11-07: qty 5

## 2018-11-07 MED ORDER — SODIUM CHLORIDE 0.9 % IV SOLN: INTRAVENOUS | Status: DC

## 2018-11-07 MED ORDER — HYDROMORPHONE HCL 1 MG/ML IJ SOLN
0.2500 mg | INTRAMUSCULAR | Status: DC | PRN
  Administered 2018-11-07: 0.25 mg via INTRAVENOUS

## 2018-11-07 MED ORDER — EPHEDRINE SULFATE 50 MG/ML IJ SOLN
INTRAMUSCULAR | Status: DC | PRN
  Administered 2018-11-07: 5 mg via INTRAVENOUS

## 2018-11-07 MED ORDER — ACETAMINOPHEN 500 MG PO TABS
1000.0000 mg | ORAL_TABLET | Freq: Four times a day (QID) | ORAL | Status: DC
  Administered 2018-11-07 – 2018-11-08 (×3): 1000 mg via ORAL
  Filled 2018-11-07 (×3): qty 2

## 2018-11-07 MED ORDER — METHOCARBAMOL 1000 MG/10ML IJ SOLN
500.0000 mg | Freq: Four times a day (QID) | INTRAVENOUS | Status: DC | PRN
  Filled 2018-11-07: qty 5

## 2018-11-07 MED ORDER — CEFAZOLIN SODIUM-DEXTROSE 2-4 GM/100ML-% IV SOLN
INTRAVENOUS | Status: AC
  Filled 2018-11-07: qty 100

## 2018-11-07 MED ORDER — SODIUM CHLORIDE 0.9% FLUSH: 3.0000 mL | INTRAVENOUS | Status: DC | PRN

## 2018-11-07 MED ORDER — THROMBIN 5000 UNITS EX SOLR
CUTANEOUS | Status: AC
  Filled 2018-11-07: qty 15000

## 2018-11-07 MED ORDER — CHLORHEXIDINE GLUCONATE CLOTH 2 % EX PADS: 6.0000 | MEDICATED_PAD | Freq: Once | CUTANEOUS | Status: DC

## 2018-11-07 MED ORDER — BUPIVACAINE HCL (PF) 0.5 % IJ SOLN
INTRAMUSCULAR | Status: AC
  Filled 2018-11-07: qty 30

## 2018-11-07 MED ORDER — 0.9 % SODIUM CHLORIDE (POUR BTL) OPTIME
TOPICAL | Status: DC | PRN
  Administered 2018-11-07: 1000 mL

## 2018-11-07 MED ORDER — CALCIUM CARBONATE ANTACID 500 MG PO CHEW: 2.0000 | CHEWABLE_TABLET | Freq: Every day | ORAL | Status: DC | PRN

## 2018-11-07 MED ORDER — DOCUSATE SODIUM 100 MG PO CAPS
100.0000 mg | ORAL_CAPSULE | Freq: Two times a day (BID) | ORAL | Status: DC
  Administered 2018-11-07: 100 mg via ORAL
  Filled 2018-11-07 (×2): qty 1

## 2018-11-07 MED ORDER — PHENOL 1.4 % MT LIQD: 1.0000 | OROMUCOSAL | Status: DC | PRN

## 2018-11-07 MED ORDER — DEXAMETHASONE SODIUM PHOSPHATE 10 MG/ML IJ SOLN
INTRAMUSCULAR | Status: DC | PRN
  Administered 2018-11-07: 10 mg via INTRAVENOUS

## 2018-11-07 MED ORDER — FLEET ENEMA 7-19 GM/118ML RE ENEM: 1.0000 | ENEMA | Freq: Once | RECTAL | Status: DC | PRN

## 2018-11-07 MED ORDER — HYDROCODONE-ACETAMINOPHEN 5-325 MG PO TABS: 1.0000 | ORAL_TABLET | ORAL | Status: DC | PRN

## 2018-11-07 MED ORDER — SUGAMMADEX SODIUM 200 MG/2ML IV SOLN
INTRAVENOUS | Status: DC | PRN
  Administered 2018-11-07: 200 mg via INTRAVENOUS

## 2018-11-07 MED ORDER — SODIUM CHLORIDE 0.9 % IV SOLN
INTRAVENOUS | Status: DC | PRN
  Administered 2018-11-07: 500 mL

## 2018-11-07 MED ORDER — PHENYLEPHRINE HCL (PRESSORS) 10 MG/ML IV SOLN
INTRAVENOUS | Status: DC | PRN
  Administered 2018-11-07: 80 ug via INTRAVENOUS

## 2018-11-07 MED ORDER — ACETAMINOPHEN 325 MG PO TABS: 650.0000 mg | ORAL_TABLET | ORAL | Status: DC | PRN

## 2018-11-07 MED ORDER — METHOCARBAMOL 500 MG PO TABS
500.0000 mg | ORAL_TABLET | Freq: Four times a day (QID) | ORAL | Status: DC | PRN
  Administered 2018-11-07 – 2018-11-08 (×2): 500 mg via ORAL
  Filled 2018-11-07 (×2): qty 1

## 2018-11-07 MED ORDER — HYDROMORPHONE HCL 1 MG/ML IJ SOLN: 0.5000 mg | INTRAMUSCULAR | Status: DC | PRN

## 2018-11-07 MED ORDER — LACTATED RINGERS IV SOLN
INTRAVENOUS | Status: DC
  Administered 2018-11-07: 10:00:00 via INTRAVENOUS

## 2018-11-07 MED ORDER — SODIUM CHLORIDE 0.9% FLUSH: 3.0000 mL | Freq: Two times a day (BID) | INTRAVENOUS | Status: DC

## 2018-11-07 MED ORDER — MAGNESIUM OXIDE 400 (241.3 MG) MG PO TABS
400.0000 mg | ORAL_TABLET | Freq: Every day | ORAL | Status: DC
  Filled 2018-11-07 (×3): qty 1

## 2018-11-07 MED ORDER — THROMBIN 5000 UNITS EX SOLR
OROMUCOSAL | Status: DC | PRN
  Administered 2018-11-07: 5 mL via TOPICAL

## 2018-11-07 MED ORDER — BUPIVACAINE HCL 0.5 % IJ SOLN
INTRAMUSCULAR | Status: DC | PRN
  Administered 2018-11-07: 4 mL

## 2018-11-07 MED ORDER — PROMETHAZINE HCL 25 MG/ML IJ SOLN
INTRAMUSCULAR | Status: AC
  Filled 2018-11-07: qty 1

## 2018-11-07 MED ORDER — GLYCOPYRROLATE PF 0.2 MG/ML IJ SOSY
PREFILLED_SYRINGE | INTRAMUSCULAR | Status: DC | PRN
  Administered 2018-11-07: .2 mg via INTRAVENOUS

## 2018-11-07 MED ORDER — ONDANSETRON HCL 4 MG/2ML IJ SOLN: 4.0000 mg | Freq: Four times a day (QID) | INTRAMUSCULAR | Status: DC | PRN

## 2018-11-07 MED ORDER — POLYVINYL ALCOHOL 1.4 % OP SOLN
1.0000 [drp] | Freq: Every day | OPHTHALMIC | Status: DC | PRN
  Filled 2018-11-07: qty 15

## 2018-11-07 MED ORDER — ONDANSETRON HCL 4 MG/2ML IJ SOLN
INTRAMUSCULAR | Status: DC | PRN
  Administered 2018-11-07: 4 mg via INTRAVENOUS

## 2018-11-07 MED ORDER — FENTANYL CITRATE (PF) 250 MCG/5ML IJ SOLN
INTRAMUSCULAR | Status: DC | PRN
  Administered 2018-11-07 (×5): 50 ug via INTRAVENOUS

## 2018-11-07 MED ORDER — OXYCODONE HCL 5 MG PO TABS
5.0000 mg | ORAL_TABLET | ORAL | Status: DC | PRN
  Filled 2018-11-07: qty 2

## 2018-11-07 MED ORDER — PRAVASTATIN SODIUM 10 MG PO TABS: 20.0000 mg | ORAL_TABLET | Freq: Every day | ORAL | Status: DC

## 2018-11-07 MED ORDER — PROPOFOL 10 MG/ML IV BOLUS
INTRAVENOUS | Status: AC
  Filled 2018-11-07: qty 20

## 2018-11-07 MED ORDER — LIDOCAINE 2% (20 MG/ML) 5 ML SYRINGE
INTRAMUSCULAR | Status: DC | PRN
  Administered 2018-11-07: 80 mg via INTRAVENOUS
  Administered 2018-11-07: 20 mg via INTRAVENOUS

## 2018-11-07 MED ORDER — ROCURONIUM BROMIDE 50 MG/5ML IV SOSY
PREFILLED_SYRINGE | INTRAVENOUS | Status: DC | PRN
  Administered 2018-11-07: 70 mg via INTRAVENOUS
  Administered 2018-11-07: 10 mg via INTRAVENOUS

## 2018-11-07 MED ORDER — KETOTIFEN FUMARATE 0.025 % OP SOLN
1.0000 [drp] | Freq: Every day | OPHTHALMIC | Status: DC | PRN
  Filled 2018-11-07: qty 5

## 2018-11-07 MED ORDER — HYDROMORPHONE HCL 1 MG/ML IJ SOLN
INTRAMUSCULAR | Status: AC
  Filled 2018-11-07: qty 1

## 2018-11-07 MED ORDER — SENNA 8.6 MG PO TABS
1.0000 | ORAL_TABLET | Freq: Two times a day (BID) | ORAL | Status: DC
  Administered 2018-11-07: 8.6 mg via ORAL
  Filled 2018-11-07 (×2): qty 1

## 2018-11-07 MED ORDER — CEFAZOLIN SODIUM-DEXTROSE 2-4 GM/100ML-% IV SOLN
2.0000 g | Freq: Three times a day (TID) | INTRAVENOUS | Status: AC
  Administered 2018-11-07 – 2018-11-08 (×2): 2 g via INTRAVENOUS
  Filled 2018-11-07 (×2): qty 100

## 2018-11-07 MED ORDER — MEPERIDINE HCL 25 MG/ML IJ SOLN: 6.2500 mg | INTRAMUSCULAR | Status: DC | PRN

## 2018-11-07 MED ORDER — BISACODYL 10 MG RE SUPP: 10.0000 mg | Freq: Every day | RECTAL | Status: DC | PRN

## 2018-11-07 MED ORDER — PROPOFOL 10 MG/ML IV BOLUS
INTRAVENOUS | Status: DC | PRN
  Administered 2018-11-07: 100 mg via INTRAVENOUS
  Administered 2018-11-07: 50 mg via INTRAVENOUS

## 2018-11-07 MED ORDER — LIDOCAINE-EPINEPHRINE 1 %-1:100000 IJ SOLN
INTRAMUSCULAR | Status: AC
  Filled 2018-11-07: qty 1

## 2018-11-07 MED ORDER — MIDAZOLAM HCL 2 MG/2ML IJ SOLN: 0.5000 mg | Freq: Once | INTRAMUSCULAR | Status: DC | PRN

## 2018-11-07 SURGICAL SUPPLY — 60 items
BAG DECANTER FOR FLEXI CONT (MISCELLANEOUS) ×3 IMPLANT
BENZOIN TINCTURE PRP APPL 2/3 (GAUZE/BANDAGES/DRESSINGS) IMPLANT
BLADE CLIPPER SURG (BLADE) IMPLANT
BLADE SURG 11 STRL SS (BLADE) ×3 IMPLANT
BLADE ULTRA TIP 2M (BLADE) IMPLANT
BUR MATCHSTICK NEURO 3.0 LAGG (BURR) ×3 IMPLANT
CANISTER SUCT 3000ML PPV (MISCELLANEOUS) ×3 IMPLANT
CARTRIDGE OIL MAESTRO DRILL (MISCELLANEOUS) ×1 IMPLANT
CLOSURE WOUND 1/2 X4 (GAUZE/BANDAGES/DRESSINGS) ×1
COVER WAND RF STERILE (DRAPES) IMPLANT
DECANTER SPIKE VIAL GLASS SM (MISCELLANEOUS) ×3 IMPLANT
DERMABOND ADVANCED (GAUZE/BANDAGES/DRESSINGS) ×2
DERMABOND ADVANCED .7 DNX12 (GAUZE/BANDAGES/DRESSINGS) ×1 IMPLANT
DEVICE ENDSKLTN IMPL 16X14X7X6 (Cage) ×1 IMPLANT
DIFFUSER DRILL AIR PNEUMATIC (MISCELLANEOUS) ×3 IMPLANT
DRAPE C-ARM 42X72 X-RAY (DRAPES) ×6 IMPLANT
DRAPE HALF SHEET 40X57 (DRAPES) IMPLANT
DRAPE LAPAROTOMY 100X72 PEDS (DRAPES) ×3 IMPLANT
DRAPE MICROSCOPE LEICA (MISCELLANEOUS) ×3 IMPLANT
DRSG OPSITE 4X5.5 SM (GAUZE/BANDAGES/DRESSINGS) ×6 IMPLANT
DRSG OPSITE POSTOP 3X4 (GAUZE/BANDAGES/DRESSINGS) ×3 IMPLANT
DURAPREP 6ML APPLICATOR 50/CS (WOUND CARE) ×3 IMPLANT
ELECT COATED BLADE 2.86 ST (ELECTRODE) ×3 IMPLANT
ELECT REM PT RETURN 9FT ADLT (ELECTROSURGICAL) ×3
ELECTRODE REM PT RTRN 9FT ADLT (ELECTROSURGICAL) ×1 IMPLANT
ENDOSKELETON IMPLANT 16X14X7X6 (Cage) ×3 IMPLANT
GAUZE 4X4 16PLY RFD (DISPOSABLE) IMPLANT
GLOVE BIO SURGEON STRL SZ7.5 (GLOVE) IMPLANT
GLOVE BIOGEL PI IND STRL 7.5 (GLOVE) ×2 IMPLANT
GLOVE BIOGEL PI INDICATOR 7.5 (GLOVE) ×4
GLOVE ECLIPSE 7.0 STRL STRAW (GLOVE) ×6 IMPLANT
GLOVE EXAM NITRILE XL STR (GLOVE) IMPLANT
GOWN STRL REUS W/ TWL LRG LVL3 (GOWN DISPOSABLE) ×2 IMPLANT
GOWN STRL REUS W/ TWL XL LVL3 (GOWN DISPOSABLE) IMPLANT
GOWN STRL REUS W/TWL 2XL LVL3 (GOWN DISPOSABLE) IMPLANT
GOWN STRL REUS W/TWL LRG LVL3 (GOWN DISPOSABLE) ×4
GOWN STRL REUS W/TWL XL LVL3 (GOWN DISPOSABLE)
HEMOSTAT POWDER KIT SURGIFOAM (HEMOSTASIS) ×3 IMPLANT
KIT BASIN OR (CUSTOM PROCEDURE TRAY) ×3 IMPLANT
KIT TURNOVER KIT B (KITS) ×3 IMPLANT
NEEDLE HYPO 22GX1.5 SAFETY (NEEDLE) ×3 IMPLANT
NEEDLE SPNL 22GX3.5 QUINCKE BK (NEEDLE) ×3 IMPLANT
NS IRRIG 1000ML POUR BTL (IV SOLUTION) ×3 IMPLANT
OIL CARTRIDGE MAESTRO DRILL (MISCELLANEOUS) ×3
PACK LAMINECTOMY NEURO (CUSTOM PROCEDURE TRAY) ×3 IMPLANT
PAD ARMBOARD 7.5X6 YLW CONV (MISCELLANEOUS) IMPLANT
PLATE ZEVO 1LVL 19MM (Plate) ×3 IMPLANT
PUTTY DBF 1CC CORTICAL FIBERS (Putty) ×3 IMPLANT
RUBBERBAND STERILE (MISCELLANEOUS) ×6 IMPLANT
SCREW 3.5 SELFDRILL 15MM VARI (Screw) ×12 IMPLANT
SPONGE INTESTINAL PEANUT (DISPOSABLE) ×3 IMPLANT
SPONGE SURGIFOAM ABS GEL SZ50 (HEMOSTASIS) IMPLANT
STAPLER VISISTAT 35W (STAPLE) ×3 IMPLANT
STRIP CLOSURE SKIN 1/2X4 (GAUZE/BANDAGES/DRESSINGS) ×2 IMPLANT
SUT VIC AB 3-0 SH 8-18 (SUTURE) ×3 IMPLANT
SUT VICRYL 3-0 RB1 18 ABS (SUTURE) ×6 IMPLANT
TAPE CLOTH 3X10 TAN LF (GAUZE/BANDAGES/DRESSINGS) ×3 IMPLANT
TOWEL GREEN STERILE (TOWEL DISPOSABLE) ×3 IMPLANT
TOWEL GREEN STERILE FF (TOWEL DISPOSABLE) ×3 IMPLANT
WATER STERILE IRR 1000ML POUR (IV SOLUTION) ×3 IMPLANT

## 2018-11-07 NOTE — Anesthesia Procedure Notes (Signed)
Procedure Name: Intubation Date/Time: 11/07/2018 12:53 PM Performed by: Glynda Jaeger, CRNA Pre-anesthesia Checklist: Patient identified, Patient being monitored, Timeout performed, Emergency Drugs available and Suction available Patient Re-evaluated:Patient Re-evaluated prior to induction Oxygen Delivery Method: Circle System Utilized Preoxygenation: Pre-oxygenation with 100% oxygen Induction Type: IV induction Ventilation: Mask ventilation without difficulty Laryngoscope Size: Glidescope and 4 Grade View: Grade I Tube type: Oral Tube size: 7.5 mm Number of attempts: 1 Airway Equipment and Method: Video-laryngoscopy Placement Confirmation: ETT inserted through vocal cords under direct vision,  positive ETCO2 and breath sounds checked- equal and bilateral Secured at: 21 cm Tube secured with: Tape Dental Injury: Teeth and Oropharynx as per pre-operative assessment

## 2018-11-07 NOTE — Progress Notes (Signed)
Orthopedic Tech Progress Note Patient Details:  Donald Rose 1945-10-13 [id number]  Ortho Devices Type of Ortho Device: Soft collar Ortho Device/Splint Interventions: Application   Post Interventions Patient Tolerated: Well Instructions Provided: Care of device   [place] Pink 11/07/2018, 3:33 PM

## 2018-11-07 NOTE — Evaluation (Signed)
Physical Therapy Evaluation Patient Details Name: Donald Rose MRN: [id number] DOB: May 25, 1945 Today's Date: 11/07/2018   History of Present Illness  Pt is a 73 y.o. male s/p C3-4 discectomy and arthrodesis (anterior) on 11/07/18. PMH includes arthritis, neuromuscular disorder.    Clinical Impression  Pt presents with an overall decrease in functional mobility secondary to above. PTA, pt independent, active and lives with supportive wife. Educ on precautions, positioning, cervical collar wear, and importance of mobility. Today, pt able to mobilize well with min guard to supervision for balance. Reports L shoulder pain/ROM improved post-op. Pt would benefit from continued acute PT services to maximize functional mobility and independence prior to d/c home.     Follow Up Recommendations No PT follow up;Supervision for mobility/OOB    Equipment Recommendations  (TBD 3in1 or shower seat)    Recommendations for Other Services       Precautions / Restrictions Precautions Precautions: Cervical;Fall Precaution Booklet Issued: No Required Braces or Orthoses: Cervical Brace Cervical Brace: Soft collar Restrictions Weight Bearing Restrictions: No      Mobility  Bed Mobility               General bed mobility comments: Received standing in bathroom with nursing staff  Transfers Overall transfer level: Needs assistance Equipment used: None Transfers: Sit to/from Stand Sit to Stand: Supervision            Ambulation/Gait Ambulation/Gait assistance: Min guard;Supervision Gait Distance (Feet): 250 Feet Assistive device: None Gait Pattern/deviations: Step-through pattern;Decreased stride length Gait velocity: Decreased Gait velocity interpretation: <1.31 ft/sec, indicative of household ambulator General Gait Details: Slow, guarded gait with initial min guard for balance, progressing to supervision-level; no overt instability or LOB  Stairs            Wheelchair  Mobility    Modified Rankin (Stroke Patients Only)       Balance Overall balance assessment: Needs assistance   Sitting balance-Leahy Scale: Good       Standing balance-Leahy Scale: Fair                               Pertinent Vitals/Pain Pain Assessment: Faces Faces Pain Scale: Hurts a little bit Pain Location: Neck, L lower back into leg Pain Descriptors / Indicators: Discomfort Pain Intervention(s): Monitored during session;Patient requesting pain meds-RN notified    Home Living Family/patient expects to be discharged to:: Private residence Living Arrangements: Spouse/significant other Available Help at Discharge: Family;Available 24 hours/day Type of Home: House Home Access: Stairs to enter Entrance Stairs-Rails: Psychiatric nurse of Steps: 3-4 Home Layout: One level Home Equipment: Walker - 2 wheels;Cane - single point;Shower seat - built in      Prior Function Level of Independence: Independent         Comments: 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[employer]; 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; mows yard     Journalist, newspaper        Extremity/Trunk Assessment   Upper Extremity Assessment Upper Extremity Assessment: Defer to OT evaluation(reports L shoulder ROM/pain improved post-op)    Lower Extremity Assessment Lower Extremity Assessment: Overall WFL for tasks assessed       Communication   Communication: No difficulties  Cognition Arousal/Alertness: Awake/alert Behavior During Therapy: WFL for tasks assessed/performed Overall Cognitive Status: Within Functional Limits for tasks assessed  General Comments General comments (skin integrity, edema, etc.): Wife present during session    Exercises     Assessment/Plan    PT Assessment Patient needs continued PT services  PT Problem List Decreased activity tolerance;Decreased balance;Decreased mobility;Decreased  knowledge of precautions;Pain       PT Treatment Interventions DME instruction;Gait training;Stair training;Functional mobility training;Therapeutic activities;Therapeutic exercise;Balance training;Patient/family education    PT Goals (Current goals can be found in the Care Plan section)  Acute Rehab PT Goals Patient Stated Goal: Return home tomorrow PT Goal Formulation: With patient Time For Goal Achievement: 11/21/18 Potential to Achieve Goals: Good    Frequency Min 5X/week   Barriers to discharge        Co-evaluation               AM-PAC PT "6 Clicks" Mobility  Outcome Measure Help needed turning from your back to your side while in a flat bed without using bedrails?: None Help needed moving from lying on your back to sitting on the side of a flat bed without using bedrails?: None Help needed moving to and from a bed to a chair (including a wheelchair)?: None Help needed standing up from a chair using your arms (e.g., wheelchair or bedside chair)?: A Little Help needed to walk in hospital room?: A Little Help needed climbing 3-5 steps with a railing? : A Little 6 Click Score: 21    End of Session Equipment Utilized During Treatment: Gait belt;Cervical collar Activity Tolerance: Patient tolerated treatment well Patient left: with call bell/phone within reach;with family/visitor present(seated EOB) Nurse Communication: Mobility status PT Visit Diagnosis: Other abnormalities of gait and mobility (R26.89)    Time: 7510-2585 PT Time Calculation (min) (ACUTE ONLY): 26 min   Charges:   PT Evaluation $PT Eval Moderate Complexity: 1 Mod PT Treatments $Gait Training: 8-22 mins   Mabeline Caras, PT, DPT Acute Rehabilitation Services  Pager [phone] Office [place] 11/07/2018, 5:32 PM

## 2018-11-07 NOTE — Op Note (Signed)
NEUROSURGERY OPERATIVE NOTE   PREOP DIAGNOSIS: Cervical stenosis with myelopathy, C3-4  POSTOP DIAGNOSIS: Same  PROCEDURE: 1. Discectomy at C3-4 for decompression of spinal cord and exiting nerve roots  2. Placement of intervertebral biomechanical device Medtronic Titan 76mm lordotic 3. Placement of anterior instrumentation consisting of interbody plate and screws - Medtronic Zevo plate  4. Use of morselized bone allograft  5. Arthrodesis C3-4, anterior interbody technique  6. Use of intraoperative microscope  SURGEON: Dr. Consuella Lose, MD  ASSISTANT: Ferne Reus, PA-C  ANESTHESIA: General Endotracheal  EBL: 100cc  SPECIMENS: None  DRAINS: None  COMPLICATIONS: None immediate  CONDITION: Hemodynamically stable to PACU  HISTORY: Donald Rose is a 73 y.o. y.o. male who initially presented to the outpatient clinic with relatively chronic left-sided pain and weakness.  His MRI did demonstrate significant disc disease with posterior osteophytosis at C3-4 which included compression of the left hemicord, and evidence of radiographic myelomalacia above the stenosis.  Treatment options were discussed including my recommendation for surgical decompression and fusion.  Risks of the surgery were reviewed in detail with the patient and his family.  After all questions were answered, informed consent was obtained.  PROCEDURE IN DETAIL: The patient was brought to the operating room and transferred to the operative table. After induction of general anesthesia, the patient was positioned on the operative table in the supine position with all pressure points meticulously padded. The skin of the neck was then prepped and draped in the usual sterile fashion.  After timeout was conducted, the skin was infiltrated with local anesthetic. Skin incision was then made sharply and Bovie electrocautery was used to dissect the subcutaneous tissue until the platysma was identified. The platysma was  then divided and undermined. The sternocleidomastoid muscle was then identified and, utilizing natural fascial planes in the neck, the prevertebral fascia was identified and the carotid sheath was retracted laterally and the trachea and esophagus retracted medially. Again using fluoroscopy, the correct disc space was identified. Bovie electrocautery was used to dissect in the subperiosteal plane and elevate the bilateral longus coli muscles. Self-retaining retractors were then placed. At this point, the microscope was draped and brought into the field, and the remainder of the case was done under the microscope using microdissecting technique.  The disc space was incised sharply and rongeurs were use to initially complete a discectomy. The high-speed drill was then used to complete discectomy until the posterior annulus was identified and removed and the posterior longitudinal ligament was identified. Using a nerve hook, the PLL was elevated, and Kerrison rongeurs were used to remove the posterior longitudinal ligament and the ventral thecal sac was identified. Using a combination of curettes and rongeurs, complete decompression of the thecal sac and exiting nerve roots at this level was completed, and verified using micro-nerve hook.  I did note significant posterior osteophytosis from the left side of the C3 and C4 vertebral bodies which did appear to compress the lateral aspect of the thecal sac.  Once decompression was completed, I was able to freely pass a small dissector within the ventral epidural space and out the C3-4 foramina bilaterally.  Having completed our decompression, attention was turned to placement of the intervertebral device. Trial spacers were used to select a 7 mm lordotic graft. This graft was then filled with morcellized allograft, and inserted so that the anterior aspect of the graft was flushed with the surface of the vertebral bodies.  After placement of the intervertebral device,  the above anterior cervical  plate was selected, and placed across the interspace. Using a high-speed drill, the cortex of the cervical vertebral bodies was punctured, and screws inserted in C3 and C4. Final fluoroscopic images in lateral projection was taken to confirm good hardware placement.  At this point, after all counts were verified to be correct, meticulous hemostasis was secured using a combination of bipolar electrocautery and passive hemostatics. The platysma muscle was then closed using interrupted 3-0 Vicryl sutures, and the skin was closed with an interrupted 3-0 Vicry subcuticular stitch. Dermabond and sterile dressings were then applied and the drapes removed.  The patient tolerated the procedure well and was extubated in the room and taken to the postanesthesia care unit in stable condition.

## 2018-11-07 NOTE — Transfer of Care (Signed)
Immediate Anesthesia Transfer of Care Note  Patient: Donald Rose  Procedure(s) Performed: ANTERIOR CERVICAL DECOMPRESSION/DISCECTOMY FUSION CERVICAL THREE- CERVICAL FOUR (N/A )  Patient Location: PACU  Anesthesia Type:General  Level of Consciousness: awake, alert  and oriented  Airway & Oxygen Therapy: Patient connected to nasal cannula oxygen  Post-op Assessment: Post -op Vital signs reviewed and stable  Post vital signs: stable  Last Vitals:  Vitals Value Taken Time  BP 139/78 11/07/18 1436  Temp    Pulse 68 11/07/18 1436  Resp 16 11/07/18 1436  SpO2 89 % 11/07/18 1436  Vitals shown include unvalidated device data.  Last Pain:  Vitals:   11/07/18 0948  TempSrc:   PainSc: 0-No pain      Patients Stated Pain Goal: 0 (30/09/23 3007)  Complications: No apparent anesthesia complications

## 2018-11-07 NOTE — Anesthesia Postprocedure Evaluation (Signed)
Anesthesia Post Note  Patient: Mertha Finders  Procedure(s) Performed: ANTERIOR CERVICAL DECOMPRESSION/DISCECTOMY FUSION CERVICAL THREE- CERVICAL FOUR (N/A )     Patient location during evaluation: PACU Anesthesia Type: General Level of consciousness: awake and alert, oriented and patient cooperative Pain management: pain level controlled Vital Signs Assessment: post-procedure vital signs reviewed and stable Respiratory status: spontaneous breathing, nonlabored ventilation and respiratory function stable Cardiovascular status: blood pressure returned to baseline and stable Postop Assessment: no apparent nausea or vomiting Anesthetic complications: no    Last Vitals:  Vitals:   11/07/18 1519 11/07/18 1547  BP: 139/77 (!) 148/85  Pulse: 74 (!) 59  Resp: 16 16  Temp: 36.7 C (!) 36.2 C  SpO2: 100% 94%    Last Pain:  Vitals:   11/07/18 1715  TempSrc:   PainSc: 4                  Keishawna Carranza,E. Anijah Spohr

## 2018-11-07 NOTE — Anesthesia Preprocedure Evaluation (Addendum)
Anesthesia Evaluation  Patient identified by MRN, date of birth, ID band Patient awake    Reviewed: Allergy & Precautions, NPO status , Patient's Chart, lab work & pertinent test results  History of Anesthesia Complications Negative for: history of anesthetic complications  Airway Mallampati: I  TM Distance: >3 FB Neck ROM: Full    Dental  (+) Dental Advisory Given   Pulmonary  11/03/2018 SARS coronavirus NEG   breath sounds clear to auscultation       Cardiovascular (-) anginanegative cardio ROS   Rhythm:Regular Rate:Normal     Neuro/Psych    GI/Hepatic negative GI ROS, Neg liver ROS,   Endo/Other  negative endocrine ROS  Renal/GU negative Renal ROS     Musculoskeletal  (+) Arthritis , Osteoarthritis,    Abdominal   Peds  Hematology negative hematology ROS (+)   Anesthesia Other Findings   Reproductive/Obstetrics                            Anesthesia Physical Anesthesia Plan  ASA: II  Anesthesia Plan: General   Post-op Pain Management:    Induction: Intravenous  PONV Risk Score and Plan: 3 and Ondansetron, Dexamethasone and Treatment may vary due to age or medical condition  Airway Management Planned: Oral ETT and Video Laryngoscope Planned  Additional Equipment:   Intra-op Plan:   Post-operative Plan: Extubation in OR  Informed Consent: I have reviewed the patients History and Physical, chart, labs and discussed the procedure including the risks, benefits and alternatives for the proposed anesthesia with the patient or authorized representative who has indicated his/her understanding and acceptance.     Dental advisory given  Plan Discussed with: CRNA and Surgeon  Anesthesia Plan Comments:        Anesthesia Quick Evaluation

## 2018-11-07 NOTE — Care Management Obs Status (Signed)
[facility] NOTIFICATION   Patient Details  Name: Donald Rose MRN: [id number] Date of Birth: April 08, 1945   Medicare Observation Status Notification Given:       Apolonio Schneiders, RN 11/07/2018, 5:52 PM

## 2018-11-08 ENCOUNTER — Encounter (HOSPITAL_COMMUNITY): Payer: Self-pay | Admitting: Neurosurgery

## 2018-11-08 DIAGNOSIS — M4712 Other spondylosis with myelopathy, cervical region: Secondary | ICD-10-CM | POA: Diagnosis not present

## 2018-11-08 LAB — BASIC METABOLIC PANEL
Anion gap: 11 (ref 5–15)
BUN: 11 mg/dL (ref 8–23)
CO2: 21 mmol/L — ABNORMAL LOW (ref 22–32)
Calcium: 9.4 mg/dL (ref 8.9–10.3)
Chloride: 107 mmol/L (ref 98–111)
Creatinine, Ser: 0.99 mg/dL (ref 0.61–1.24)
GFR calc Af Amer: >60 mL/min (ref >60)
GFR calc non Af Amer: >60 mL/min (ref >60)
Glucose, Bld: 123 mg/dL — ABNORMAL HIGH (ref 70–99)
Potassium: 4.1 mmol/L (ref 3.5–5.1)
Sodium: 139 mmol/L (ref 135–145)

## 2018-11-08 LAB — CBC
HCT: 46.2 % (ref 39.0–52.0)
Hemoglobin: 15.5 g/dL (ref 13.0–17.0)
MCH: 30.6 pg (ref 26.0–34.0)
MCHC: 33.5 g/dL (ref 30.0–36.0)
MCV: 91.1 fL (ref 80.0–100.0)
Platelets: 216 10*3/uL (ref 150–400)
RBC: 5.07 MIL/uL (ref 4.22–5.81)
RDW: 13.1 % (ref 11.5–15.5)
WBC: 15.2 10*3/uL — ABNORMAL HIGH (ref 4.0–10.5)
nRBC: 0 % (ref 0.0–0.2)

## 2018-11-08 MED ORDER — OXYCODONE-ACETAMINOPHEN 7.5-325 MG PO TABS: 1.0000 | ORAL_TABLET | ORAL | 0 refills | Status: DC | PRN

## 2018-11-08 MED ORDER — CELECOXIB 200 MG PO CAPS: 200.0000 mg | ORAL_CAPSULE | Freq: Every day | ORAL | Status: DC

## 2018-11-08 NOTE — Discharge Summary (Signed)
Physician Discharge Summary  Patient ID: Donald Rose MRN: [id number] DOB/AGE: [birth year] 73 y.o.  Admit date: 11/07/2018 Discharge date: 11/08/2018  Admission Diagnoses:  Cervical stenosis with myelopathy  Discharge Diagnoses:  Same Active Problems:   Stenosis of cervical spine with myelopathy [facility])  Discharged Condition: Stable  Hospital Course:  Donald Rose is a 73 y.o. male who was admitted for the below procedure. There were no post operative complications. At time of discharge, pain was well controlled, ambulating with Pt/OT, tolerating po, voiding normal. Ready for discharge.  Treatments: Surgery - C3-4 ACDF  Discharge Exam: Blood pressure (!) 148/75, pulse 66, temperature 98.1 F (36.7 C), temperature source Oral, resp. rate 16, height 5\' 10"  (1.778 m), weight 90.8 kg, SpO2 94 %. Awake, alert, oriented Speech fluent, appropriate CN grossly intact 5/5 BUE/BLE with exception 4+ tricep and grip Wound c/d/i, minimal swelling  Disposition: Discharge disposition: 01-Home or Self Care       Discharge Instructions    Call MD for:  difficulty breathing, headache or visual disturbances   Complete by: As directed    Call MD for:  persistant dizziness or light-headedness   Complete by: As directed    Call MD for:  redness, tenderness, or signs of infection (pain, swelling, redness, odor or green/yellow discharge around incision site)   Complete by: As directed    Call MD for:  severe uncontrolled pain   Complete by: As directed    Call MD for:  temperature >100.4   Complete by: As directed    Diet general   Complete by: As directed    Driving Restrictions   Complete by: As directed    Do not drive until given clearance.   Increase activity slowly   Complete by: As directed    Lifting restrictions   Complete by: As directed    Do not lift anything >10lbs. Avoid bending and twisting in awkward positions. Avoid bending at the back.   May shower / Bathe    Complete by: As directed    In 24 hours. Okay to wash wound with warm soapy water. Avoid scrubbing the wound. Pat dry.   Remove dressing in 24 hours   Complete by: As directed      Allergies as of 11/08/2018      Reactions   Merbromin    --Mercurochrome-- UNSPECIFIED REACTION       Medication List    STOP taking these medications   ibuprofen 200 MG tablet Commonly known as: ADVIL   naproxen sodium 220 MG tablet Commonly known as: ALEVE     TAKE these medications   ALLERGY EYE DROPS OP Place 1 drop into both eyes daily as needed (allergies).   B COMPLEX VITAMINS SL Place 1 tablet under the tongue daily as needed (energy).   calcium carbonate 500 MG chewable tablet Commonly known as: TUMS - dosed in mg elemental calcium Chew 2 tablets by mouth daily as needed for indigestion or heartburn.   celecoxib 200 MG capsule Commonly known as: CELEBREX Take 1 capsule (200 mg total) by mouth daily. Start taking on: November 14, 2018 What changed: These instructions start on November 14, 2018. If you are unsure what to do until then, ask your doctor or other care provider.   clotrimazole 1 % cream Commonly known as: LOTRIMIN Apply 1 application topically daily as needed (toe itch).   LUBRICATING EYE DROPS OP Place 1 drop into both eyes daily as needed (dry eyes).   magnesium oxide 400  MG tablet Commonly known as: MAG-OX Take 400 mg by mouth daily.   OVER THE COUNTER MEDICATION Place 1 Dose under the tongue daily as needed (leg spsams). CBD oil   OVER THE COUNTER MEDICATION Apply 1 application topically daily as needed (shoulder pain). Topical CBD cream   oxyCODONE-acetaminophen 7.5-325 MG tablet Commonly known as: Percocet Take 1 tablet by mouth every 4 (four) hours as needed for severe pain.   pravastatin 20 MG tablet Commonly known as: PRAVACHOL Take 20 mg by mouth daily.      Follow-up Information    Donald Lose, MD. Schedule an appointment as soon as  possible for a visit in 3 week(s).   Specialty: Neurosurgery Contact information: [address] [address] [place] [address] [phone]           Signed: Traci Sermon 11/08/2018, 8:06 AM

## 2018-11-08 NOTE — Progress Notes (Signed)
Physical Therapy Treatment Patient Details Name: Donald Rose MRN: [id number] DOB: [birth year] Today's Date: 11/08/2018    History of Present Illness Pt is a 73 y.o. male s/p C3-4 discectomy and arthrodesis (anterior) on 11/07/18. PMH includes arthritis, neuromuscular disorder.    PT Comments    Pt progressing well with post-op mobility. Pt requires cues to maintain precautions, but overall safe with ambulation. Pt was educated on car transfer and activity progression at home. He reports plans to continue with car repairs and lawn care at d/c and educated again on need to refrain from heavy lifting and maintenance of cervical precautions. Pt anticipates d/c home today. Will continue to follow.     Follow Up Recommendations  No PT follow up;Supervision for mobility/OOB     Equipment Recommendations  None recommended by PT    Recommendations for Other Services       Precautions / Restrictions Precautions Precautions: Cervical;Fall Precaution Booklet Issued: Yes (comment) Precaution Comments: Reviewed cervical precautions. PA in room prior and doffing collar for patient Required Braces or Orthoses: Cervical Brace Cervical Brace: Soft collar;At all times Restrictions Weight Bearing Restrictions: No    Mobility  Bed Mobility               General bed mobility comments: Pt was received sitting up in chair upon PT arrival.   Transfers Overall transfer level: Modified independent Equipment used: None Transfers: Sit to/from Stand           General transfer comment: No assist required. Pt with no unsteadiness or LOB.   Ambulation/Gait Ambulation/Gait assistance: Modified independent (Device/Increase time) Gait Distance (Feet): 400 Feet Assistive device: None Gait Pattern/deviations: Step-through pattern;Decreased stride length Gait velocity: Decreased Gait velocity interpretation: 1.31 - 2.62 ft/sec, indicative of limited community ambulator General Gait Details:  Somewhat guarded gait but overall no overt LOB noted. Pt declined stair training.    Stairs             Wheelchair Mobility    Modified Rankin (Stroke Patients Only)       Balance Overall balance assessment: Needs assistance   Sitting balance-Leahy Scale: Good       Standing balance-Leahy Scale: Fair                              Cognition Arousal/Alertness: Awake/alert Behavior During Therapy: WFL for tasks assessed/performed Overall Cognitive Status: Within Functional Limits for tasks assessed                                        Exercises      General Comments        Pertinent Vitals/Pain Pain Assessment: Faces Faces Pain Scale: No hurt Pain Intervention(s): Monitored during session    Home Living                      Prior Function            PT Goals (current goals can now be found in the care plan section) Acute Rehab PT Goals Patient Stated Goal: Return home tomorrow PT Goal Formulation: With patient Time For Goal Achievement: 11/21/18 Potential to Achieve Goals: Good Progress towards PT goals: Progressing toward goals    Frequency    Min 5X/week      PT Plan Current plan remains appropriate    Co-evaluation  AM-PAC PT "6 Clicks" Mobility   Outcome Measure  Help needed turning from your back to your side while in a flat bed without using bedrails?: None Help needed moving from lying on your back to sitting on the side of a flat bed without using bedrails?: None Help needed moving to and from a bed to a chair (including a wheelchair)?: None Help needed standing up from a chair using your arms (e.g., wheelchair or bedside chair)?: A Little Help needed to walk in hospital room?: A Little Help needed climbing 3-5 steps with a railing? : A Little 6 Click Score: 21    End of Session Equipment Utilized During Treatment: Gait belt;Cervical collar Activity Tolerance: Patient  tolerated treatment well Patient left: with call bell/phone within reach;with family/visitor present(seated EOB) Nurse Communication: Mobility status PT Visit Diagnosis: Other abnormalities of gait and mobility (R26.89)     Time: 3845-3646 PT Time Calculation (min) (ACUTE ONLY): 15 min  Charges:  $Gait Training: 8-22 mins                     Rolinda Roan, PT, DPT Acute Rehabilitation Services Pager: [phone] Office: [phone]    Thelma Comp 11/08/2018, 2:07 PM

## 2018-11-08 NOTE — Progress Notes (Signed)
  NEUROSURGERY PROGRESS NOTE   No issues overnight.  Denies any significant discomfort, only taking tylenol Mild dysphagia, but tolerating regular diet No concerns this am  EXAM:  BP (!) 148/75 (BP Location: Right Arm)   Pulse 66   Temp 98.1 F (36.7 C) (Oral)   Resp 16   Ht 5\' 10"  (1.778 m)   Wt 90.8 kg   SpO2 94%   BMI 28.71 kg/m   Awake, alert, oriented  Speech fluent, appropriate  CN grossly intact  5/5 BUE/BLE, 4+ grip and tricep baseline   IMPRESSION/PLAN 73 y.o. male pod #1 C3-4 ACDF, doing well - d/c home

## 2018-11-08 NOTE — Evaluation (Signed)
Occupational Therapy Evaluation Patient Details Name: Donald Rose MRN: [id number] DOB: [birth year] Today's Date: 11/08/2018    History of Present Illness Pt is a 73 y.o. male s/p C3-4 discectomy and arthrodesis (anterior) on 11/07/18. PMH includes arthritis, neuromuscular disorder.   Clinical Impression   PTA, pt was living with his wife and was independent. Currently, pt performing ADLs and functional mobility at supervision level. Provided education and handout on cervical precautions, collar management, UB ADLs, LB ADLs, toileting, and tub transfer; pt demonstrated and verablized understanding. Providing pt with handout and education on FM exercises to increase LUE coordination; pt verbalized understanding. Answered all pt questions. Recommend dc home once medically stable per physician. All acute OT needs met and will sign off. Thank you.     Follow Up Recommendations  No OT follow up(May need hand therapy after recover)    Equipment Recommendations  None recommended by OT    Recommendations for Other Services       Precautions / Restrictions Precautions Precautions: Cervical;Fall Precaution Booklet Issued: Yes (comment) Precaution Comments: Reviewed cervical precautions. PA in room prior and doffing collar for patient Required Braces or Orthoses: Cervical Brace Cervical Brace: Soft collar;At all times Restrictions Weight Bearing Restrictions: No      Mobility Bed Mobility               General bed mobility comments: Pt educated on log roll. Continued to pull up into sitting from supine.  Transfers Overall transfer level: Needs assistance Equipment used: None Transfers: Sit to/from Stand Sit to Stand: Supervision              Balance Overall balance assessment: Needs assistance   Sitting balance-Leahy Scale: Good       Standing balance-Leahy Scale: Fair                             ADL either performed or assessed with clinical judgement    ADL Overall ADL's : Needs assistance/impaired                                       General ADL Comments: Pt performing ADLs and functional mobility at Supervision level. Providing pt with handout and education on LB ADLs, brace management, grooming, bed mobility, and cervical precautions     Vision         Perception     Praxis      Pertinent Vitals/Pain Pain Assessment: Faces Faces Pain Scale: Hurts a little bit Pain Location: Neck, L lower back into leg Pain Descriptors / Indicators: Discomfort Pain Intervention(s): Monitored during session;Repositioned     Hand Dominance     Extremity/Trunk Assessment Upper Extremity Assessment Upper Extremity Assessment: LUE deficits/detail LUE Deficits / Details: Decreased FM skilsl and strength at left hand. Pt reporting increased coorindation and strength since sx. Pt able to perform finger opposition slowly. LUE Coordination: decreased fine motor;decreased gross motor   Lower Extremity Assessment Lower Extremity Assessment: Overall WFL for tasks assessed   Cervical / Trunk Assessment Cervical / Trunk Assessment: Other exceptions Cervical / Trunk Exceptions: s/p   Communication Communication Communication: No difficulties   Cognition Arousal/Alertness: Awake/alert Behavior During Therapy: WFL for tasks assessed/performed Overall Cognitive Status: Within Functional Limits for tasks assessed  General Comments       Exercises Exercises: Other exercises Other Exercises Other Exercises: Provided handout on FM exercises. Pt verablized understanding   Shoulder Instructions      Home Living Family/patient expects to be discharged to:: Private residence Living Arrangements: Spouse/significant other Available Help at Discharge: Family;Available 24 hours/day Type of Home: House Home Access: Stairs to enter [employer] of Steps: 3-4 Entrance  Stairs-Rails: Right;Left Home Layout: One level     Bathroom Shower/Tub: Tub/shower unit;Walk-in shower   Bathroom Toilet: Standard     Home Equipment: Environmental consultant - 2 wheels;Cane - single point;Shower seat - built in          Prior Functioning/Environment Level of Independence: Independent        Comments: 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[employer]; 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; mows yard        OT Problem List: Decreased activity tolerance;Decreased knowledge of use of DME or AE;Decreased knowledge of precautions;Impaired UE functional use      OT Treatment/Interventions:      OT Goals(Current goals can be found in the care plan section) Acute Rehab OT Goals Patient Stated Goal: Return home tomorrow OT Goal Formulation: All assessment and education complete, DC therapy  OT Frequency:     Barriers to D/C:            Co-evaluation              AM-PAC OT "6 Clicks" Daily Activity     Outcome Measure Help from another person eating meals?: None Help from another person taking care of personal grooming?: None Help from another person toileting, which includes using toliet, bedpan, or urinal?: None Help from another person bathing (including washing, rinsing, drying)?: None Help from another person to put on and taking off regular upper body clothing?: None Help from another person to put on and taking off regular lower body clothing?: None 6 Click Score: 24   End of Session Equipment Utilized During Treatment: Cervical collar Nurse Communication: Mobility status  Activity Tolerance: Patient tolerated treatment well Patient left: in chair;with call bell/phone within reach  OT Visit Diagnosis: Unsteadiness on feet (R26.81);Muscle weakness (generalized) (M62.81)                Time: 7948-0165 OT Time Calculation (min): 22 min Charges:  OT General Charges $OT Visit: 1 Visit OT Evaluation $OT Eval Low Complexity: [place], OTR/L Acute  Rehab Pager: [phone] Office: [place] 11/08/2018, 9:02 AM

## 2018-11-08 NOTE — Progress Notes (Signed)
Pt doing well. Pt given D/C instructions with verbal understanding. Rx was given to the Pt. Pt's incision is clean and dry with no sign of infection. Pt's IV was removed prior to D/C. Pt D/C'd home via wheelchair per MD order. Pt is stable @ D/C and has no other needs at this time. Holli Humbles, RN

## 2019-05-01 ENCOUNTER — Ambulatory Visit: Payer: Medicare PPO | Attending: Internal Medicine

## 2019-05-01 DIAGNOSIS — Z23 Encounter for immunization: Secondary | ICD-10-CM | POA: Insufficient documentation

## 2019-05-01 NOTE — Progress Notes (Signed)
   Covid-19 Vaccination Clinic  Name:  Izeiah Silverstone    MRN: [id number] DOB: Nov 21, 1945  05/01/2019  Mr. Clapham was observed post Covid-19 immunization for 30 minutes based on pre-vaccination screening without incidence. He was provided with Vaccine Information Sheet and instruction to access the V-Safe system.   Mr. Janeczek was instructed to call 911 with any severe reactions post vaccine: Marland Kitchen Difficulty breathing  . Swelling of your face and throat  . A fast heartbeat  . A bad rash all over your body  . Dizziness and weakness    Immunizations Administered    Name Date Dose VIS Date Route   Pfizer COVID-19 Vaccine 05/01/2019 11:13 AM 0.3 mL 03/02/2019 Intramuscular   Manufacturer: [place]   Lot: [id number]   [place]: [id number]

## 2019-05-26 ENCOUNTER — Ambulatory Visit: Payer: Medicare PPO | Attending: Internal Medicine

## 2019-05-26 DIAGNOSIS — Z23 Encounter for immunization: Secondary | ICD-10-CM

## 2019-05-26 NOTE — Progress Notes (Signed)
   Covid-19 Vaccination Clinic  Name:  Donald Rose    MRN: [id number] DOB: 1945/07/28  05/26/2019  [name] was observed post Covid-19 immunization for 15 minutes without incident. He was provided with Vaccine Information Sheet and instruction to access the V-Safe system.   [name] was instructed to call 911 with any severe reactions post vaccine: Marland Kitchen Difficulty breathing  . Swelling of face and throat  . A fast heartbeat  . A bad rash all over body  . Dizziness and weakness   Immunizations Administered    Name Date Dose VIS Date Route   Pfizer COVID-19 Vaccine 05/26/2019  8:11 AM 0.3 mL 03/02/2019 Intramuscular   Manufacturer: [place]   Lot: [id number]   [place]: [id number]

## 2019-10-18 DIAGNOSIS — L309 Dermatitis, unspecified: Secondary | ICD-10-CM | POA: Diagnosis not present

## 2019-10-18 DIAGNOSIS — L570 Actinic keratosis: Secondary | ICD-10-CM | POA: Diagnosis not present

## 2019-10-18 DIAGNOSIS — L219 Seborrheic dermatitis, unspecified: Secondary | ICD-10-CM | POA: Diagnosis not present

## 2019-12-03 DIAGNOSIS — H60501 Unspecified acute noninfective otitis externa, right ear: Secondary | ICD-10-CM | POA: Diagnosis not present

## 2019-12-03 DIAGNOSIS — H9191 Unspecified hearing loss, right ear: Secondary | ICD-10-CM | POA: Diagnosis not present

## 2020-03-04 DIAGNOSIS — L570 Actinic keratosis: Secondary | ICD-10-CM | POA: Diagnosis not present

## 2020-05-05 DIAGNOSIS — L821 Other seborrheic keratosis: Secondary | ICD-10-CM | POA: Diagnosis not present

## 2020-05-05 DIAGNOSIS — L570 Actinic keratosis: Secondary | ICD-10-CM | POA: Diagnosis not present

## 2020-05-05 DIAGNOSIS — D1801 Hemangioma of skin and subcutaneous tissue: Secondary | ICD-10-CM | POA: Diagnosis not present

## 2020-05-05 DIAGNOSIS — Z85828 Personal history of other malignant neoplasm of skin: Secondary | ICD-10-CM | POA: Diagnosis not present

## 2020-05-05 DIAGNOSIS — L578 Other skin changes due to chronic exposure to nonionizing radiation: Secondary | ICD-10-CM | POA: Diagnosis not present

## 2020-05-05 DIAGNOSIS — L814 Other melanin hyperpigmentation: Secondary | ICD-10-CM | POA: Diagnosis not present

## 2020-07-14 DIAGNOSIS — E785 Hyperlipidemia, unspecified: Secondary | ICD-10-CM | POA: Diagnosis not present

## 2020-07-14 DIAGNOSIS — Z125 Encounter for screening for malignant neoplasm of prostate: Secondary | ICD-10-CM | POA: Diagnosis not present

## 2020-07-14 DIAGNOSIS — E538 Deficiency of other specified B group vitamins: Secondary | ICD-10-CM | POA: Diagnosis not present

## 2020-07-18 DIAGNOSIS — H5213 Myopia, bilateral: Secondary | ICD-10-CM | POA: Diagnosis not present

## 2020-07-18 DIAGNOSIS — H26493 Other secondary cataract, bilateral: Secondary | ICD-10-CM | POA: Diagnosis not present

## 2020-07-21 DIAGNOSIS — K5900 Constipation, unspecified: Secondary | ICD-10-CM | POA: Diagnosis not present

## 2020-07-21 DIAGNOSIS — M25512 Pain in left shoulder: Secondary | ICD-10-CM | POA: Diagnosis not present

## 2020-07-21 DIAGNOSIS — Z0000 Encounter for general adult medical examination without abnormal findings: Secondary | ICD-10-CM | POA: Diagnosis not present

## 2020-07-21 DIAGNOSIS — M545 Low back pain, unspecified: Secondary | ICD-10-CM | POA: Diagnosis not present

## 2020-07-21 DIAGNOSIS — R82998 Other abnormal findings in urine: Secondary | ICD-10-CM | POA: Diagnosis not present

## 2020-07-21 DIAGNOSIS — G4700 Insomnia, unspecified: Secondary | ICD-10-CM | POA: Diagnosis not present

## 2020-07-21 DIAGNOSIS — G8929 Other chronic pain: Secondary | ICD-10-CM | POA: Diagnosis not present

## 2020-07-21 DIAGNOSIS — E785 Hyperlipidemia, unspecified: Secondary | ICD-10-CM | POA: Diagnosis not present

## 2020-07-21 DIAGNOSIS — E538 Deficiency of other specified B group vitamins: Secondary | ICD-10-CM | POA: Diagnosis not present

## 2020-07-21 DIAGNOSIS — Z1212 Encounter for screening for malignant neoplasm of rectum: Secondary | ICD-10-CM | POA: Diagnosis not present

## 2020-07-21 DIAGNOSIS — I10 Essential (primary) hypertension: Secondary | ICD-10-CM | POA: Diagnosis not present

## 2020-11-03 DIAGNOSIS — L578 Other skin changes due to chronic exposure to nonionizing radiation: Secondary | ICD-10-CM | POA: Diagnosis not present

## 2020-11-03 DIAGNOSIS — L570 Actinic keratosis: Secondary | ICD-10-CM | POA: Diagnosis not present

## 2020-11-03 DIAGNOSIS — L821 Other seborrheic keratosis: Secondary | ICD-10-CM | POA: Diagnosis not present

## 2021-01-09 ENCOUNTER — Encounter: Payer: Self-pay | Admitting: Gastroenterology

## 2021-01-29 ENCOUNTER — Encounter: Payer: Self-pay | Admitting: Gastroenterology

## 2021-02-05 ENCOUNTER — Ambulatory Visit (AMBULATORY_SURGERY_CENTER): Payer: Medicare PPO

## 2021-02-05 ENCOUNTER — Other Ambulatory Visit: Payer: Self-pay

## 2021-02-05 VITALS — Ht 70.0 in | Wt 201.0 lb

## 2021-02-05 DIAGNOSIS — Z86010 Personal history of colonic polyps: Secondary | ICD-10-CM

## 2021-02-05 MED ORDER — NA SULFATE-K SULFATE-MG SULF 17.5-3.13-1.6 GM/177ML PO SOLN: 1.0000 | Freq: Once | ORAL | 0 refills | Status: AC

## 2021-02-05 NOTE — Progress Notes (Signed)
No egg or soy allergy known to patient  No issues known to pt with past sedation with any surgeries or procedures Patient denies ever being told they had issues or difficulty with intubation  No FH of Malignant Hyperthermia Pt is not on diet pills Pt is not on  home 02  Pt is not on blood thinners  Pt denies issues with constipation  No A fib or A flutter  Pt is fully vaccinated  for Covid     NO PA's for preps discussed with pt In PV today  Discussed with pt there will be an out-of-pocket cost for prep and that varies from $0 to 70 +  dollars - pt verbalized understanding   Due to the COVID-19 pandemic we are asking patients to follow certain guidelines in PV and the [place]   Pt aware of COVID protocols and [facility] guidelines

## 2021-02-06 ENCOUNTER — Encounter: Payer: Self-pay | Admitting: Gastroenterology

## 2021-03-03 ENCOUNTER — Encounter: Payer: Self-pay | Admitting: Internal Medicine

## 2021-03-03 ENCOUNTER — Ambulatory Visit (AMBULATORY_SURGERY_CENTER): Payer: Medicare PPO | Admitting: Internal Medicine

## 2021-03-03 ENCOUNTER — Other Ambulatory Visit: Payer: Self-pay

## 2021-03-03 VITALS — BP 127/76 | HR 55 | Temp 97.8°F | Resp 17 | Ht 70.0 in | Wt 201.0 lb

## 2021-03-03 DIAGNOSIS — D122 Benign neoplasm of ascending colon: Secondary | ICD-10-CM

## 2021-03-03 DIAGNOSIS — Z86010 Personal history of colonic polyps: Secondary | ICD-10-CM | POA: Diagnosis not present

## 2021-03-03 DIAGNOSIS — D124 Benign neoplasm of descending colon: Secondary | ICD-10-CM

## 2021-03-03 DIAGNOSIS — K635 Polyp of colon: Secondary | ICD-10-CM | POA: Diagnosis not present

## 2021-03-03 MED ORDER — SODIUM CHLORIDE 0.9 % IV SOLN: 500.0000 mL | Freq: Once | INTRAVENOUS | Status: DC

## 2021-03-03 NOTE — Progress Notes (Signed)
Pt's states no medical or surgical changes since previsit or office visit. VS by [place] 

## 2021-03-03 NOTE — Progress Notes (Signed)
Called to room to assist during endoscopic procedure.  Patient ID and intended procedure confirmed with present staff. Received instructions for my participation in the procedure from the performing physician.  

## 2021-03-03 NOTE — Patient Instructions (Addendum)
Handouts were given to your care partner on polyps, diverticulosis and hemorrhoids. You may resume your current medications today. Await biopsy results.  May take 1-3 weeks to receive pathology results. Please call if any questions or concerns.      YOU HAD AN ENDOSCOPIC PROCEDURE TODAY AT [facility]:   Refer to the procedure report that was given to you for any specific questions about what was found during the examination.  If the procedure report does not answer your questions, please call your gastroenterologist to clarify.  If you requested that your care partner not be given the details of your procedure findings, then the procedure report has been included in a sealed envelope for you to review at your convenience later.  YOU SHOULD EXPECT: Some feelings of bloating in the abdomen. Passage of more gas than usual.  Walking can help get rid of the air that was put into your GI tract during the procedure and reduce the bloating. If you had a lower endoscopy (such as a colonoscopy or flexible sigmoidoscopy) you may notice spotting of blood in your stool or on the toilet paper. If you underwent a bowel prep for your procedure, you may not have a normal bowel movement for a few days.  Please Note:  You might notice some irritation and congestion in your nose or some drainage.  This is from the oxygen used during your procedure.  There is no need for concern and it should clear up in a day or so.  SYMPTOMS TO REPORT IMMEDIATELY:  Following lower endoscopy (colonoscopy or flexible sigmoidoscopy):  Excessive amounts of blood in the stool  Significant tenderness or worsening of abdominal pains  Swelling of the abdomen that is new, acute  Fever of 100F or higher   For urgent or emergent issues, a gastroenterologist can be reached at any hour by calling [phone]. Do not use MyChart messaging for urgent concerns.    DIET:  We do recommend a small meal at first, but then  you may proceed to your regular diet.  Drink plenty of fluids but you should avoid alcoholic beverages for 24 hours.  ACTIVITY:  You should plan to take it easy for the rest of today and you should NOT DRIVE or use heavy machinery until tomorrow (because of the sedation medicines used during the test).    FOLLOW UP: Our staff will call the number listed on your records 48-72 hours following your procedure to check on you and address any questions or concerns that you may have regarding the information given to you following your procedure. If we do not reach you, we will leave a message.  We will attempt to reach you two times.  During this call, we will ask if you have developed any symptoms of COVID 19. If you develop any symptoms (ie: fever, flu-like symptoms, shortness of breath, cough etc.) before then, please call [phone].  If you test positive for Covid 19 in the 2 weeks post procedure, please call and report this information to [place].    If any biopsies were taken you will be contacted by phone or by letter within the next 1-3 weeks.  Please call us at [phone] if you have not heard about the biopsies in 3 weeks.    SIGNATURES/CONFIDENTIALITY: You and/or your care partner have signed paperwork which will be entered into your electronic medical record.  These signatures attest to the fact that that the information above on your After  Visit Summary has been reviewed and is understood.  Full responsibility of the confidentiality of this discharge information lies with you and/or your care-partner.

## 2021-03-03 NOTE — Progress Notes (Signed)
PT taken to PACU. Monitors in place. VSS. Report given to RN. 

## 2021-03-03 NOTE — Progress Notes (Signed)
No problems noted in the recovery room. maw 

## 2021-03-03 NOTE — Progress Notes (Signed)
HISTORY OF PRESENT ILLNESS:  Donald Rose is a 75 y.o. male who presents today for surveillance colonoscopy.  Previous examination with Dr. Tarri Glenn October 2019 revealed 3 diminutive adenomatous and left-sided diverticulosis.  No active complaints.  REVIEW OF SYSTEMS:  All non-GI ROS negative. 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   Cataract    removed bilat    Hyperlipidemia    Neuromuscular disorder ([place])    undetermined at this time , bone spurs    Past Surgical History:  Procedure Laterality Date   ANTERIOR CERVICAL DECOMP/DISCECTOMY FUSION N/A 11/07/2018   Procedure: ANTERIOR CERVICAL DECOMPRESSION/DISCECTOMY FUSION CERVICAL THREE- CERVICAL FOUR;  Surgeon: Consuella Lose, MD;  Location: [place];  Service: Neurosurgery;  Laterality: N/A;  ANTERIOR CERVICAL DECOMPRESSION/DISCECTOMY FUSION CERVICAL THREE- CERVICAL FOUR   CARPAL TUNNEL RELEASE Left 05/30/2018   Procedure: LEFT CARPAL TUNNEL RELEASE;  Surgeon: Daryll Brod, MD;  Location: [place];  Service: Orthopedics;  Laterality: Left;   CATARACT EXTRACTION, BILATERAL     COLONOSCOPY W/ POLYPECTOMY  12/2017   DENTAL SURGERY      Social History Donald Rose  reports that he has never smoked. He has never used smokeless tobacco. He reports that he does not currently use alcohol. He reports that he does not use drugs.  family history includes Arthritis in his maternal grandmother, mother, and paternal grandmother; Cancer in his father and mother; Early death in his son.  Allergies  Allergen Reactions   Merbromin     --Mercurochrome-- UNSPECIFIED REACTION        PHYSICAL EXAMINATION:  Vital signs: BP 127/81    Pulse 65    Temp 97.8 F (36.6 C)    Ht 5\' 10"  (1.778 m)    Wt 201 lb (91.2 kg)    SpO2 95%    BMI 28.84 kg/m  General: Well-developed, well-nourished, no acute distress HEENT: Sclerae are anicteric, conjunctiva pink. Oral mucosa intact Lungs:  Clear Heart: Regular Abdomen: soft, nontender, nondistended, no obvious ascites, no peritoneal signs, normal bowel sounds. No organomegaly. Extremities: No edema Psychiatric: alert and oriented x3. Cooperative     ASSESSMENT:  1.  History of multiple adenomas due for surveillance.   PLAN:   1.  Surveillance colonoscopy

## 2021-03-03 NOTE — Op Note (Signed)
[facility] Patient Name: Donald Rose Procedure Date: 03/03/2021 2:32 PM MRN: [id number] Endoscopist: Docia Chuck. Donald Rose , MD Age: 75 Referring MD:  Date of Birth: Feb 10, 1946 Gender: Male Account #: [id number] Procedure:                Colonoscopy with cold snare polypectomy x 2 Indications:              High risk colon cancer surveillance: Personal                            history of multiple (3 or more) adenomas. Previous                            examination (Dr. Tarri Glenn) January 04, 2018. 3                            diminutive adenomas. Now for surveillance Medicines:                Monitored Anesthesia Care Procedure:                Pre-Anesthesia Assessment:                           - Prior to the procedure, a History and Physical                            was performed, and patient medications and                            allergies were reviewed. The patient's tolerance of                            previous anesthesia was also reviewed. The risks                            and benefits of the procedure and the sedation                            options and risks were discussed with the patient.                            All questions were answered, and informed consent                            was obtained. Prior Anticoagulants: The patient has                            taken no previous anticoagulant or antiplatelet                            agents. ASA Grade Assessment: II - A patient with                            mild systemic disease. After reviewing the risks  and benefits, the patient was deemed in                            satisfactory condition to undergo the procedure.                           After obtaining informed consent, the colonoscope                            was passed under direct vision. Throughout the                            procedure, the patient's blood pressure, pulse, and                             oxygen saturations were monitored continuously. The                            Olympus CF-HQ190L ([id number]) Colonoscope was                            introduced through the anus and advanced to the the                            cecum, identified by appendiceal orifice and                            ileocecal valve. The ileocecal valve, appendiceal                            orifice, and rectum were photographed. The quality                            of the bowel preparation was excellent. The                            colonoscopy was performed without difficulty. The                            patient tolerated the procedure well. The bowel                            preparation used was SUPREP via split dose                            instruction. Scope In: 2:50:26 PM Scope Out: 3:10:36 PM Scope Withdrawal Time: 0 hours 17 minutes 18 seconds  Total Procedure Duration: 0 hours 20 minutes 10 seconds  Findings:                 Two polyps were found in the descending colon and                            ascending colon. The polyps were 3 to 4 mm in size.  These polyps were removed with a cold snare.                            Resection and retrieval were complete.                           A few small-mouthed diverticula were found in the                            sigmoid colon.                           Internal hemorrhoids were found during                            retroflexion. The hemorrhoids were moderate.                           The exam was otherwise without abnormality on                            direct and retroflexion views. Complications:            No immediate complications. Estimated blood loss:                            None. Estimated Blood Loss:     Estimated blood loss: none. Impression:               - Two 3 to 4 mm polyps in the descending colon and                            in the ascending colon, removed with a cold snare.                             Resected and retrieved.                           - Diverticulosis in the sigmoid colon.                           - Internal hemorrhoids.                           - The examination was otherwise normal on direct                            and retroflexion views. Recommendation:           - Repeat colonoscopy in 5 years for surveillance                            (Dr. Tarri Glenn).                           - Patient has a contact number available for  emergencies. The signs and symptoms of potential                            delayed complications were discussed with the                            patient. Return to normal activities tomorrow.                            Written discharge instructions were provided to the                            patient.                           - Resume previous diet.                           - Continue present medications.                           - Await pathology results. Docia Chuck. Donald Pastor, MD 03/03/2021 3:15:14 PM This report has been signed electronically.

## 2021-03-05 ENCOUNTER — Telehealth: Payer: Self-pay | Admitting: *Deleted

## 2021-03-05 NOTE — Telephone Encounter (Signed)
°  Follow up Call-  Call back number 03/03/2021  Post procedure Call Back phone  # [phone]  Permission to leave phone message Yes  Some recent data might be hidden     Patient questions:  Do you have a fever, pain , or abdominal swelling? No. Pain Score  0 *  Have you tolerated food without any problems? Yes.    Have you been able to return to your normal activities? Yes.    Do you have any questions about your discharge instructions: Diet   No. Medications  No. Follow up visit  No.  Do you have questions or concerns about your Care? No.  Actions: * If pain score is 4 or above: No action needed, pain <4.  Have you developed a fever since your procedure? no  2.   Have you had an respiratory symptoms (SOB or cough) since your procedure? no  3.   Have you tested positive for COVID 19 since your procedure no  4.   Have you had any family members/close contacts diagnosed with the COVID 19 since your procedure?  no   If yes to any of these questions please route to Joylene John, RN and Joella Prince, RN

## 2021-03-06 ENCOUNTER — Encounter: Payer: Self-pay | Admitting: Internal Medicine

## 2021-05-07 DIAGNOSIS — Z85828 Personal history of other malignant neoplasm of skin: Secondary | ICD-10-CM | POA: Diagnosis not present

## 2021-05-07 DIAGNOSIS — L578 Other skin changes due to chronic exposure to nonionizing radiation: Secondary | ICD-10-CM | POA: Diagnosis not present

## 2021-05-07 DIAGNOSIS — L821 Other seborrheic keratosis: Secondary | ICD-10-CM | POA: Diagnosis not present

## 2021-05-07 DIAGNOSIS — D1801 Hemangioma of skin and subcutaneous tissue: Secondary | ICD-10-CM | POA: Diagnosis not present

## 2021-05-07 DIAGNOSIS — L570 Actinic keratosis: Secondary | ICD-10-CM | POA: Diagnosis not present

## 2021-05-07 DIAGNOSIS — L814 Other melanin hyperpigmentation: Secondary | ICD-10-CM | POA: Diagnosis not present

## 2021-07-21 DIAGNOSIS — H26492 Other secondary cataract, left eye: Secondary | ICD-10-CM | POA: Diagnosis not present

## 2021-07-21 DIAGNOSIS — H524 Presbyopia: Secondary | ICD-10-CM | POA: Diagnosis not present

## 2021-07-21 DIAGNOSIS — H35372 Puckering of macula, left eye: Secondary | ICD-10-CM | POA: Diagnosis not present

## 2021-07-27 DIAGNOSIS — H26492 Other secondary cataract, left eye: Secondary | ICD-10-CM | POA: Diagnosis not present

## 2021-08-11 DIAGNOSIS — Z125 Encounter for screening for malignant neoplasm of prostate: Secondary | ICD-10-CM | POA: Diagnosis not present

## 2021-08-11 DIAGNOSIS — E538 Deficiency of other specified B group vitamins: Secondary | ICD-10-CM | POA: Diagnosis not present

## 2021-08-11 DIAGNOSIS — Z0000 Encounter for general adult medical examination without abnormal findings: Secondary | ICD-10-CM | POA: Diagnosis not present

## 2021-08-11 DIAGNOSIS — E785 Hyperlipidemia, unspecified: Secondary | ICD-10-CM | POA: Diagnosis not present

## 2021-08-11 DIAGNOSIS — I10 Essential (primary) hypertension: Secondary | ICD-10-CM | POA: Diagnosis not present

## 2021-08-18 DIAGNOSIS — R82998 Other abnormal findings in urine: Secondary | ICD-10-CM | POA: Diagnosis not present

## 2021-08-18 DIAGNOSIS — Z1339 Encounter for screening examination for other mental health and behavioral disorders: Secondary | ICD-10-CM | POA: Diagnosis not present

## 2021-08-18 DIAGNOSIS — E785 Hyperlipidemia, unspecified: Secondary | ICD-10-CM | POA: Diagnosis not present

## 2021-08-18 DIAGNOSIS — Z1331 Encounter for screening for depression: Secondary | ICD-10-CM | POA: Diagnosis not present

## 2021-08-18 DIAGNOSIS — M545 Low back pain, unspecified: Secondary | ICD-10-CM | POA: Diagnosis not present

## 2021-08-18 DIAGNOSIS — G4700 Insomnia, unspecified: Secondary | ICD-10-CM | POA: Diagnosis not present

## 2021-08-18 DIAGNOSIS — I10 Essential (primary) hypertension: Secondary | ICD-10-CM | POA: Diagnosis not present

## 2021-08-18 DIAGNOSIS — M25512 Pain in left shoulder: Secondary | ICD-10-CM | POA: Diagnosis not present

## 2021-08-18 DIAGNOSIS — Z0000 Encounter for general adult medical examination without abnormal findings: Secondary | ICD-10-CM | POA: Diagnosis not present

## 2021-08-18 DIAGNOSIS — E538 Deficiency of other specified B group vitamins: Secondary | ICD-10-CM | POA: Diagnosis not present

## 2021-10-14 DIAGNOSIS — M5000 Cervical disc disorder with myelopathy, unspecified cervical region: Secondary | ICD-10-CM | POA: Diagnosis not present

## 2021-10-15 DIAGNOSIS — M5126 Other intervertebral disc displacement, lumbar region: Secondary | ICD-10-CM | POA: Diagnosis not present

## 2021-10-15 DIAGNOSIS — M5000 Cervical disc disorder with myelopathy, unspecified cervical region: Secondary | ICD-10-CM | POA: Diagnosis not present

## 2021-11-16 DIAGNOSIS — M5000 Cervical disc disorder with myelopathy, unspecified cervical region: Secondary | ICD-10-CM | POA: Diagnosis not present

## 2022-01-28 DIAGNOSIS — D229 Melanocytic nevi, unspecified: Secondary | ICD-10-CM | POA: Diagnosis not present

## 2022-01-28 DIAGNOSIS — L570 Actinic keratosis: Secondary | ICD-10-CM | POA: Diagnosis not present

## 2022-01-28 DIAGNOSIS — D171 Benign lipomatous neoplasm of skin and subcutaneous tissue of trunk: Secondary | ICD-10-CM | POA: Diagnosis not present

## 2022-01-28 DIAGNOSIS — L821 Other seborrheic keratosis: Secondary | ICD-10-CM | POA: Diagnosis not present

## 2022-02-18 DIAGNOSIS — M25512 Pain in left shoulder: Secondary | ICD-10-CM | POA: Diagnosis not present

## 2022-02-18 DIAGNOSIS — G4700 Insomnia, unspecified: Secondary | ICD-10-CM | POA: Diagnosis not present

## 2022-02-18 DIAGNOSIS — M545 Low back pain, unspecified: Secondary | ICD-10-CM | POA: Diagnosis not present

## 2022-02-18 DIAGNOSIS — I10 Essential (primary) hypertension: Secondary | ICD-10-CM | POA: Diagnosis not present

## 2022-02-18 DIAGNOSIS — D171 Benign lipomatous neoplasm of skin and subcutaneous tissue of trunk: Secondary | ICD-10-CM | POA: Diagnosis not present

## 2022-03-02 DIAGNOSIS — M25512 Pain in left shoulder: Secondary | ICD-10-CM | POA: Diagnosis not present

## 2022-03-10 DIAGNOSIS — S46012D Strain of muscle(s) and tendon(s) of the rotator cuff of left shoulder, subsequent encounter: Secondary | ICD-10-CM | POA: Diagnosis not present

## 2022-03-31 DIAGNOSIS — S46012D Strain of muscle(s) and tendon(s) of the rotator cuff of left shoulder, subsequent encounter: Secondary | ICD-10-CM | POA: Diagnosis not present

## 2022-04-07 DIAGNOSIS — S46012D Strain of muscle(s) and tendon(s) of the rotator cuff of left shoulder, subsequent encounter: Secondary | ICD-10-CM | POA: Diagnosis not present

## 2022-04-14 DIAGNOSIS — S46012D Strain of muscle(s) and tendon(s) of the rotator cuff of left shoulder, subsequent encounter: Secondary | ICD-10-CM | POA: Diagnosis not present

## 2022-05-06 DIAGNOSIS — M25512 Pain in left shoulder: Secondary | ICD-10-CM | POA: Diagnosis not present

## 2022-05-07 DIAGNOSIS — Z85828 Personal history of other malignant neoplasm of skin: Secondary | ICD-10-CM | POA: Diagnosis not present

## 2022-05-07 DIAGNOSIS — L814 Other melanin hyperpigmentation: Secondary | ICD-10-CM | POA: Diagnosis not present

## 2022-05-07 DIAGNOSIS — L570 Actinic keratosis: Secondary | ICD-10-CM | POA: Diagnosis not present

## 2022-05-07 DIAGNOSIS — C44519 Basal cell carcinoma of skin of other part of trunk: Secondary | ICD-10-CM | POA: Diagnosis not present

## 2022-05-07 DIAGNOSIS — D1801 Hemangioma of skin and subcutaneous tissue: Secondary | ICD-10-CM | POA: Diagnosis not present

## 2022-05-07 DIAGNOSIS — L821 Other seborrheic keratosis: Secondary | ICD-10-CM | POA: Diagnosis not present

## 2022-05-07 DIAGNOSIS — L578 Other skin changes due to chronic exposure to nonionizing radiation: Secondary | ICD-10-CM | POA: Diagnosis not present

## 2022-05-07 DIAGNOSIS — D0421 Carcinoma in situ of skin of right ear and external auricular canal: Secondary | ICD-10-CM | POA: Diagnosis not present

## 2022-05-07 DIAGNOSIS — D485 Neoplasm of uncertain behavior of skin: Secondary | ICD-10-CM | POA: Diagnosis not present

## 2022-06-02 DIAGNOSIS — C44599 Other specified malignant neoplasm of skin of other part of trunk: Secondary | ICD-10-CM | POA: Diagnosis not present

## 2022-06-08 DIAGNOSIS — M25512 Pain in left shoulder: Secondary | ICD-10-CM | POA: Diagnosis not present

## 2022-06-12 DIAGNOSIS — M25512 Pain in left shoulder: Secondary | ICD-10-CM | POA: Diagnosis not present

## 2022-06-22 DIAGNOSIS — M25512 Pain in left shoulder: Secondary | ICD-10-CM | POA: Diagnosis not present

## 2022-08-10 DIAGNOSIS — Z961 Presence of intraocular lens: Secondary | ICD-10-CM | POA: Diagnosis not present

## 2022-08-10 DIAGNOSIS — H35372 Puckering of macula, left eye: Secondary | ICD-10-CM | POA: Diagnosis not present

## 2022-09-14 DIAGNOSIS — D099 Carcinoma in situ, unspecified: Secondary | ICD-10-CM | POA: Diagnosis not present

## 2022-09-14 DIAGNOSIS — C4491 Basal cell carcinoma of skin, unspecified: Secondary | ICD-10-CM | POA: Diagnosis not present

## 2022-09-14 DIAGNOSIS — L570 Actinic keratosis: Secondary | ICD-10-CM | POA: Diagnosis not present

## 2022-09-21 ENCOUNTER — Encounter: Payer: Self-pay | Admitting: Diagnostic Neuroimaging

## 2022-09-21 ENCOUNTER — Ambulatory Visit: Payer: Medicare PPO | Admitting: Diagnostic Neuroimaging

## 2022-09-21 VITALS — BP 125/72 | HR 87 | Ht 70.0 in | Wt 200.6 lb

## 2022-09-21 DIAGNOSIS — M4802 Spinal stenosis, cervical region: Secondary | ICD-10-CM | POA: Diagnosis not present

## 2022-09-21 DIAGNOSIS — G8194 Hemiplegia, unspecified affecting left nondominant side: Secondary | ICD-10-CM

## 2022-09-21 DIAGNOSIS — G992 Myelopathy in diseases classified elsewhere: Secondary | ICD-10-CM

## 2022-09-21 NOTE — Progress Notes (Signed)
[facility]  PATIENT: Donald Rose DOB: Mar 08, 1946  REFERRING CLINICIAN: Bjorn Pippin, MD HISTORY FROM: patient  REASON FOR VISIT: new consult   HISTORICAL  CHIEF COMPLAINT:  Chief Complaint  Patient presents with   New Patient (Initial Visit)    Patient in room #7 and alone. Patient states here today to discuss his left shoulder and cervical. Patient states weakness and his left shoulder, leg and not able to close fist.     HISTORY OF PRESENT ILLNESS:   77 year old male here for evaluation of left shoulder tightness, left hand weakness, left leg weakness.  Symptoms started around 2019 with gradual and progressive onset of left arm and leg weakness.  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  He underwent cervical decompression surgery in 2020.  Unfortunately he has not had significant improvement in symptoms.  In fact he may have had some gradual worsening of symptoms over the past 4 years.  He followed up with neurosurgery clinic in 2023, had repeat MRI of the cervical spine which showed adequate decompression and no further surgically treatable issues.  He is followed up with orthopedic clinic for left shoulder evaluation and MRI of the left shoulder demonstrates some low-grade degenerative changes but no other surgically treatable issues.  Patient was then referred here to consider EMG nerve conduction study.  Of note patient also had history of left carpal tunnel syndrome status post surgery also in 2020.   REVIEW OF SYSTEMS: Full 14 system review of systems performed and negative with exception of: as per HPI.  ALLERGIES: Allergies  Allergen Reactions   Merbromin     --Mercurochrome-- UNSPECIFIED REACTION     HOME MEDICATIONS: Outpatient Medications Prior to Visit  Medication Sig Dispense Refill   B  COMPLEX VITAMINS SL Place 1 tablet under the tongue daily as needed (energy).     calcium carbonate (TUMS - DOSED IN MG ELEMENTAL CALCIUM) 500 MG chewable tablet Chew 2 tablets by mouth daily as needed for indigestion or heartburn.     Carboxymethylcellul-Glycerin (LUBRICATING EYE DROPS OP) Place 1 drop into both eyes daily as needed (dry eyes).     clotrimazole (LOTRIMIN) 1 % cream Apply 1 application topically daily as needed (toe itch).     Ketotifen Fumarate (ALLERGY EYE DROPS OP) Place 1 drop into both eyes daily as needed (allergies).     rosuvastatin (CRESTOR) 10 MG tablet Take 10 mg by mouth daily.     rosuvastatin (CRESTOR) 20 MG tablet Take 20 mg by mouth daily.     tamsulosin (FLOMAX) 0.4 MG CAPS capsule Take 0.4 mg by mouth daily.     celecoxib (CELEBREX) 200 MG capsule Take 1 capsule (200 mg total) by mouth daily.     OVER THE COUNTER MEDICATION Place 1 Dose under the tongue daily as needed (leg spsams). CBD oil     No facility-administered medications prior to visit.    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   Cataract    removed bilat    Hyperlipidemia    Neuromuscular disorder (HCC)    undetermined at this time , bone spurs    PAST SURGICAL HISTORY: Past Surgical History:  Procedure Laterality Date   ANTERIOR CERVICAL DECOMP/DISCECTOMY FUSION N/A 11/07/2018   Procedure: ANTERIOR CERVICAL DECOMPRESSION/DISCECTOMY  FUSION CERVICAL THREE- CERVICAL FOUR;  Surgeon: Lisbeth Renshaw, MD;  Location: [facility] OR;  Service: Neurosurgery;  Laterality: N/A;  ANTERIOR CERVICAL DECOMPRESSION/DISCECTOMY FUSION CERVICAL THREE- CERVICAL FOUR   CARPAL TUNNEL RELEASE Left 05/30/2018   Procedure: LEFT CARPAL TUNNEL RELEASE;  Surgeon: Cindee Salt, MD;  Location: [facility];  Service: Orthopedics;  Laterality: Left;   CATARACT EXTRACTION, BILATERAL     COLONOSCOPY W/ POLYPECTOMY  12/2017   DENTAL SURGERY       FAMILY HISTORY: Family History  Problem Relation Age of Onset   Arthritis Mother    Cancer Mother    Cancer Father    Early death Son    Arthritis Maternal Grandmother    Arthritis Paternal Grandmother    Colon polyps Neg Hx    Colon cancer Neg Hx    Esophageal cancer Neg Hx    Rectal cancer Neg Hx    Stomach cancer Neg Hx     SOCIAL HISTORY: Social History   Socioeconomic History   Marital status: Married    Spouse name: Diplomatic Services operational officer   Number of children: Not on file   Years of education: Not on file   Highest education level: Not on file  Occupational History   Not on file  Tobacco Use   Smoking status: Never   Smokeless tobacco: Never  Vaping Use   Vaping Use: Never used  Substance and Sexual Activity   Alcohol use: Not Currently    Comment: 1 drink every month or so   Drug use: Never   Sexual activity: Not on file  Other Topics Concern   Not on file  Social History Narrative   Not on file   Social Determinants of Health   Financial Resource Strain: Not on file  Food Insecurity: Not on file  Transportation Needs: Not on file  Physical Activity: Not on file  Stress: Not on file  Social Connections: Not on file  Intimate Partner Violence: Not on file     PHYSICAL EXAM  GENERAL EXAM/CONSTITUTIONAL: Vitals:  Vitals:   09/21/22 1410  BP: 125/72  Pulse: 87  Weight: 200 lb 9.6 oz (91 kg)  Height: 5\' 10"  (1.778 m)   Body mass index is 28.78 kg/m. Wt Readings from Last 3 Encounters:  09/21/22 200 lb 9.6 oz (91 kg)  03/03/21 201 lb (91.2 kg)  02/05/21 201 lb (91.2 kg)   Patient is in no distress; well developed, nourished and groomed; neck is supple  CARDIOVASCULAR: Examination of carotid arteries is normal; no carotid bruits Regular rate and rhythm, no murmurs Examination of peripheral vascular system by observation and palpation is normal  EYES: Ophthalmoscopic exam of optic discs and posterior segments is normal; no papilledema or  hemorrhages No results found.  MUSCULOSKELETAL: Gait, strength, tone, movements noted in Neurologic exam below  NEUROLOGIC: MENTAL STATUS:      No data to display         awake, alert, oriented to person, place and time recent and remote memory intact normal attention and concentration language fluent, comprehension intact, naming intact fund of knowledge appropriate  CRANIAL NERVE:  2nd - no papilledema on fundoscopic exam 2nd, 3rd, 4th, 6th - pupils equal and reactive to light, visual fields full to confrontation, extraocular muscles intact, no nystagmus 5th - facial sensation symmetric 7th - facial strength symmetric 8th - hearing intact 9th - palate elevates symmetrically, uvula midline 11th - shoulder shrug symmetric 12th - tongue protrusion midline  MOTOR:  normal bulk and  tone, full strength in the RUE, RLE LUE DELTOID, BICEPS, TRICEPS 4; FINGER ABDUCTION 3, GRIP 4; SLIGHTLY INCREASE TONE LLE HIP FLEX 3-4, KE 4+, KF 4, DF 3-4; SLIGHTLY INCREASED TONE  SENSORY:  normal and symmetric to light touch, temperature, vibration  COORDINATION:  finger-nose-finger, fine finger movements normal  REFLEXES:  deep tendon reflexes --> SLIGHTLY BRISK IN LUE AND LLE; NEG HOFFMANS  GAIT/STATION:  narrow based gait; ANTALGIA GAIT; LEFT HEMIPARESIS GAIT     DIAGNOSTIC DATA (LABS, IMAGING, TESTING) - I reviewed patient records, labs, notes, testing and imaging myself where available.  Lab Results  Component Value Date   WBC 15.2 (H) 11/08/2018   HGB 15.5 11/08/2018   HCT 46.2 11/08/2018   MCV 91.1 11/08/2018   PLT 216 11/08/2018      Component Value Date/Time   NA 139 11/08/2018 0737   K 4.1 11/08/2018 0737   CL 107 11/08/2018 0737   CO2 21 (L) 11/08/2018 0737   GLUCOSE 123 (H) 11/08/2018 0737   BUN 11 11/08/2018 0737   CREATININE 0.99 11/08/2018 0737   CREATININE 0.93 07/16/2016 0844   CALCIUM 9.4 11/08/2018 0737   PROT 6.6 07/16/2016 0844   ALBUMIN 3.9  07/16/2016 0844   AST 15 07/16/2016 0844   ALT 15 07/16/2016 0844   ALKPHOS 59 07/16/2016 0844   BILITOT 0.5 07/16/2016 0844   GFRNONAA >60 11/08/2018 0737   GFRNONAA 83 07/16/2016 0844   GFRAA >60 11/08/2018 0737   GFRAA >89 07/16/2016 0844   Lab Results  Component Value Date   CHOL 203 (H) 07/16/2016   HDL 43 07/16/2016   LDLCALC 131 (H) 07/16/2016   TRIG 143 07/16/2016   CHOLHDL 4.7 07/16/2016   No results found for: "HGBA1C" No results found for: "VITAMINB12" Lab Results  Component Value Date   TSH 0.82 07/16/2016     ASSESSMENT AND PLAN  77 y.o. year old male here with:  Dx:  1. Stenosis of cervical spine with myelopathy (HCC)   2. 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 Conchita Paris in July 2023, with repeat MRI in Aug 2023 that was stable - do not suspect peripheral etiology; EMG/NCS not likely to help dx / tx options  Return for return to referring provider, return to PCP.    Suanne Marker, MD 09/21/2022, 3:07 PM Certified in Neurology, Neurophysiology and Neuroimaging  [facility] [address], [address] [place], [place] [address] [phone]

## 2022-09-21 NOTE — Patient Instructions (Addendum)
  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 Conchita Paris in July 2023, with repeat MRI in Aug 2023 that was stable - do not suspect peripheral etiology; EMG/NCS not likely to help dx / tx options

## 2022-09-24 ENCOUNTER — Ambulatory Visit: Payer: Medicare PPO | Admitting: Diagnostic Neuroimaging

## 2022-10-18 DIAGNOSIS — I10 Essential (primary) hypertension: Secondary | ICD-10-CM | POA: Diagnosis not present

## 2022-10-18 DIAGNOSIS — R972 Elevated prostate specific antigen [PSA]: Secondary | ICD-10-CM | POA: Diagnosis not present

## 2022-10-18 DIAGNOSIS — E785 Hyperlipidemia, unspecified: Secondary | ICD-10-CM | POA: Diagnosis not present

## 2022-10-18 DIAGNOSIS — E538 Deficiency of other specified B group vitamins: Secondary | ICD-10-CM | POA: Diagnosis not present

## 2022-10-25 DIAGNOSIS — Z1331 Encounter for screening for depression: Secondary | ICD-10-CM | POA: Diagnosis not present

## 2022-10-25 DIAGNOSIS — Z0000 Encounter for general adult medical examination without abnormal findings: Secondary | ICD-10-CM | POA: Diagnosis not present

## 2022-10-25 DIAGNOSIS — E538 Deficiency of other specified B group vitamins: Secondary | ICD-10-CM | POA: Diagnosis not present

## 2022-10-25 DIAGNOSIS — M545 Low back pain, unspecified: Secondary | ICD-10-CM | POA: Diagnosis not present

## 2022-10-25 DIAGNOSIS — Z1339 Encounter for screening examination for other mental health and behavioral disorders: Secondary | ICD-10-CM | POA: Diagnosis not present

## 2022-10-25 DIAGNOSIS — G4700 Insomnia, unspecified: Secondary | ICD-10-CM | POA: Diagnosis not present

## 2022-10-25 DIAGNOSIS — E785 Hyperlipidemia, unspecified: Secondary | ICD-10-CM | POA: Diagnosis not present

## 2022-10-25 DIAGNOSIS — R82998 Other abnormal findings in urine: Secondary | ICD-10-CM | POA: Diagnosis not present

## 2022-10-25 DIAGNOSIS — I10 Essential (primary) hypertension: Secondary | ICD-10-CM | POA: Diagnosis not present

## 2023-03-31 DIAGNOSIS — R0781 Pleurodynia: Secondary | ICD-10-CM | POA: Diagnosis not present

## 2023-03-31 DIAGNOSIS — J0100 Acute maxillary sinusitis, unspecified: Secondary | ICD-10-CM | POA: Diagnosis not present

## 2023-04-27 DIAGNOSIS — D485 Neoplasm of uncertain behavior of skin: Secondary | ICD-10-CM | POA: Diagnosis not present

## 2023-04-27 DIAGNOSIS — L570 Actinic keratosis: Secondary | ICD-10-CM | POA: Diagnosis not present

## 2023-04-27 DIAGNOSIS — B079 Viral wart, unspecified: Secondary | ICD-10-CM | POA: Diagnosis not present

## 2023-04-27 DIAGNOSIS — L820 Inflamed seborrheic keratosis: Secondary | ICD-10-CM | POA: Diagnosis not present

## 2023-05-31 DIAGNOSIS — D044 Carcinoma in situ of skin of scalp and neck: Secondary | ICD-10-CM | POA: Diagnosis not present

## 2023-05-31 DIAGNOSIS — D485 Neoplasm of uncertain behavior of skin: Secondary | ICD-10-CM | POA: Diagnosis not present

## 2023-05-31 DIAGNOSIS — L570 Actinic keratosis: Secondary | ICD-10-CM | POA: Diagnosis not present

## 2023-05-31 DIAGNOSIS — L821 Other seborrheic keratosis: Secondary | ICD-10-CM | POA: Diagnosis not present

## 2023-05-31 DIAGNOSIS — L814 Other melanin hyperpigmentation: Secondary | ICD-10-CM | POA: Diagnosis not present

## 2023-05-31 DIAGNOSIS — L578 Other skin changes due to chronic exposure to nonionizing radiation: Secondary | ICD-10-CM | POA: Diagnosis not present

## 2023-05-31 DIAGNOSIS — Z85828 Personal history of other malignant neoplasm of skin: Secondary | ICD-10-CM | POA: Diagnosis not present

## 2023-05-31 DIAGNOSIS — D1801 Hemangioma of skin and subcutaneous tissue: Secondary | ICD-10-CM | POA: Diagnosis not present

## 2023-05-31 DIAGNOSIS — L820 Inflamed seborrheic keratosis: Secondary | ICD-10-CM | POA: Diagnosis not present

## 2023-06-13 DIAGNOSIS — C4442 Squamous cell carcinoma of skin of scalp and neck: Secondary | ICD-10-CM | POA: Diagnosis not present

## 2023-08-12 DIAGNOSIS — Z961 Presence of intraocular lens: Secondary | ICD-10-CM | POA: Diagnosis not present

## 2023-08-12 DIAGNOSIS — H10413 Chronic giant papillary conjunctivitis, bilateral: Secondary | ICD-10-CM | POA: Diagnosis not present

## 2023-11-08 DIAGNOSIS — E785 Hyperlipidemia, unspecified: Secondary | ICD-10-CM | POA: Diagnosis not present

## 2023-11-08 DIAGNOSIS — I10 Essential (primary) hypertension: Secondary | ICD-10-CM | POA: Diagnosis not present

## 2023-11-08 DIAGNOSIS — Z1212 Encounter for screening for malignant neoplasm of rectum: Secondary | ICD-10-CM | POA: Diagnosis not present

## 2023-11-08 DIAGNOSIS — E538 Deficiency of other specified B group vitamins: Secondary | ICD-10-CM | POA: Diagnosis not present

## 2023-11-08 DIAGNOSIS — R972 Elevated prostate specific antigen [PSA]: Secondary | ICD-10-CM | POA: Diagnosis not present

## 2023-11-15 DIAGNOSIS — R200 Anesthesia of skin: Secondary | ICD-10-CM | POA: Diagnosis not present

## 2023-11-15 DIAGNOSIS — R202 Paresthesia of skin: Secondary | ICD-10-CM | POA: Diagnosis not present

## 2023-11-15 DIAGNOSIS — Z1339 Encounter for screening examination for other mental health and behavioral disorders: Secondary | ICD-10-CM | POA: Diagnosis not present

## 2023-11-15 DIAGNOSIS — Z1331 Encounter for screening for depression: Secondary | ICD-10-CM | POA: Diagnosis not present

## 2023-11-15 DIAGNOSIS — M5416 Radiculopathy, lumbar region: Secondary | ICD-10-CM | POA: Diagnosis not present

## 2023-11-15 DIAGNOSIS — I10 Essential (primary) hypertension: Secondary | ICD-10-CM | POA: Diagnosis not present

## 2023-11-15 DIAGNOSIS — Z0000 Encounter for general adult medical examination without abnormal findings: Secondary | ICD-10-CM | POA: Diagnosis not present

## 2023-11-15 DIAGNOSIS — E538 Deficiency of other specified B group vitamins: Secondary | ICD-10-CM | POA: Diagnosis not present

## 2023-11-15 DIAGNOSIS — R82998 Other abnormal findings in urine: Secondary | ICD-10-CM | POA: Diagnosis not present

## 2023-11-15 DIAGNOSIS — G4700 Insomnia, unspecified: Secondary | ICD-10-CM | POA: Diagnosis not present

## 2023-11-15 DIAGNOSIS — E785 Hyperlipidemia, unspecified: Secondary | ICD-10-CM | POA: Diagnosis not present

## 2023-11-15 DIAGNOSIS — R972 Elevated prostate specific antigen [PSA]: Secondary | ICD-10-CM | POA: Diagnosis not present

## 2023-11-22 ENCOUNTER — Other Ambulatory Visit: Payer: Self-pay | Admitting: Internal Medicine

## 2023-11-22 DIAGNOSIS — M5416 Radiculopathy, lumbar region: Secondary | ICD-10-CM

## 2023-11-30 ENCOUNTER — Ambulatory Visit
Admission: RE | Admit: 2023-11-30 | Discharge: 2023-11-30 | Disposition: A | Discharge status: Home / Self Care | Source: Ambulatory Visit | Attending: Internal Medicine | Admitting: Internal Medicine

## 2023-11-30 DIAGNOSIS — M4726 Other spondylosis with radiculopathy, lumbar region: Secondary | ICD-10-CM | POA: Diagnosis not present

## 2023-11-30 DIAGNOSIS — M5416 Radiculopathy, lumbar region: Secondary | ICD-10-CM

## 2023-11-30 DIAGNOSIS — M5116 Intervertebral disc disorders with radiculopathy, lumbar region: Secondary | ICD-10-CM | POA: Diagnosis not present

## 2023-11-30 DIAGNOSIS — M48061 Spinal stenosis, lumbar region without neurogenic claudication: Secondary | ICD-10-CM | POA: Diagnosis not present

## 2023-12-07 DIAGNOSIS — R29898 Other symptoms and signs involving the musculoskeletal system: Secondary | ICD-10-CM | POA: Diagnosis not present

## 2023-12-26 DIAGNOSIS — R972 Elevated prostate specific antigen [PSA]: Secondary | ICD-10-CM | POA: Diagnosis not present

## 2023-12-28 DIAGNOSIS — Z85828 Personal history of other malignant neoplasm of skin: Secondary | ICD-10-CM | POA: Diagnosis not present

## 2023-12-28 DIAGNOSIS — L814 Other melanin hyperpigmentation: Secondary | ICD-10-CM | POA: Diagnosis not present

## 2023-12-28 DIAGNOSIS — L821 Other seborrheic keratosis: Secondary | ICD-10-CM | POA: Diagnosis not present

## 2023-12-28 DIAGNOSIS — D485 Neoplasm of uncertain behavior of skin: Secondary | ICD-10-CM | POA: Diagnosis not present

## 2023-12-28 DIAGNOSIS — L570 Actinic keratosis: Secondary | ICD-10-CM | POA: Diagnosis not present

## 2023-12-28 DIAGNOSIS — L578 Other skin changes due to chronic exposure to nonionizing radiation: Secondary | ICD-10-CM | POA: Diagnosis not present

## 2023-12-28 DIAGNOSIS — B079 Viral wart, unspecified: Secondary | ICD-10-CM | POA: Diagnosis not present

## 2023-12-28 DIAGNOSIS — D044 Carcinoma in situ of skin of scalp and neck: Secondary | ICD-10-CM | POA: Diagnosis not present

## 2023-12-28 DIAGNOSIS — D1801 Hemangioma of skin and subcutaneous tissue: Secondary | ICD-10-CM | POA: Diagnosis not present

## 2024-01-25 DIAGNOSIS — C4442 Squamous cell carcinoma of skin of scalp and neck: Secondary | ICD-10-CM | POA: Diagnosis not present

## 2024-04-03 ENCOUNTER — Encounter: Payer: Self-pay | Admitting: Diagnostic Neuroimaging

## 2024-04-03 ENCOUNTER — Ambulatory Visit: Admitting: Diagnostic Neuroimaging

## 2024-04-03 VITALS — BP 156/81 | HR 61 | Ht 70.0 in | Wt 204.2 lb

## 2024-04-03 DIAGNOSIS — G8194 Hemiplegia, unspecified affecting left nondominant side: Secondary | ICD-10-CM

## 2024-04-03 DIAGNOSIS — G992 Myelopathy in diseases classified elsewhere: Secondary | ICD-10-CM

## 2024-04-03 DIAGNOSIS — M4802 Spinal stenosis, cervical region: Secondary | ICD-10-CM | POA: Diagnosis not present

## 2024-04-03 NOTE — Patient Instructions (Signed)
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 Lanis in July 2023, with repeat MRI in Aug 2023 that was stable - do not suspect peripheral etiology; has significant upper motor neuron signs and clinical history localizing to cervical spine pathology; EMG/NCS not likely to help dx / tx options - follow up with neurosurgery to consider repeat MRI cervical spine "

## 2024-04-03 NOTE — Progress Notes (Signed)
 "  [facility]  PATIENT: Donald Rose DOB: [birth year]  REFERRING CLINICIAN: Bonner Ade, MD HISTORY FROM: patient  REASON FOR VISIT: follow up   HISTORICAL  CHIEF COMPLAINT:  Chief Complaint  Patient presents with   RM 7     Patient is here alone for Weakness of left lower extremity - has been going on for years and has been getting worse since the summer     HISTORY OF PRESENT ILLNESS:   UPDATE (04/03/24, VRP): Since last visit, doing about the same. Referred here for left arm and left leg symptoms. Patient notes that symptoms are stable to slightly progressed since 2024.   PRIOR HPI (09/21/22, VRP): 79 year old male here for evaluation of left shoulder tightness, left hand weakness, left leg weakness.  Symptoms started around 2019 with gradual and progressive onset of left arm and leg weakness.  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  He underwent cervical decompression surgery in 2020.  Unfortunately he has not had significant improvement in symptoms.  In fact he may have had some gradual worsening of symptoms over the past 4 years.  He followed up with neurosurgery clinic in 2023, had repeat MRI of the cervical spine which showed adequate decompression and no further surgically treatable issues.  He is followed up with orthopedic clinic for left shoulder evaluation and MRI of the left shoulder demonstrates some low-grade degenerative changes but no other surgically treatable issues.  Patient was then referred here to consider EMG nerve conduction study.  Of note patient also had history of left carpal tunnel syndrome status post surgery also in 2020.   REVIEW OF SYSTEMS: Full 14 system review of systems performed and negative with exception of: as per HPI.  ALLERGIES: Allergies  Allergen Reactions    Benzalkonium Chloride Other (See Comments)   Merbromin     --Mercurochrome-- UNSPECIFIED REACTION     HOME MEDICATIONS: Outpatient Medications Prior to Visit  Medication Sig Dispense Refill   B COMPLEX VITAMINS SL Place 1 tablet under the tongue daily as needed (energy).     calcium  carbonate (TUMS - DOSED IN MG ELEMENTAL CALCIUM ) 500 MG chewable tablet Chew 2 tablets by mouth daily as needed for indigestion or heartburn.     Carboxymethylcellul-Glycerin  (LUBRICATING EYE DROPS OP) Place 1 drop into both eyes daily as needed (dry eyes).     clotrimazole (LOTRIMIN) 1 % cream Apply 1 application topically daily as needed (toe itch).     Cyanocobalamin  (B-12 PO) Take by mouth.     Ketotifen  Fumarate (ALLERGY EYE DROPS OP) Place 1 drop into both eyes daily as needed (allergies).     Magnesium  200 MG TABS      rosuvastatin (CRESTOR) 10 MG tablet Take 10 mg by mouth daily.     rosuvastatin (CRESTOR) 20 MG tablet Take 20 mg by mouth daily.     tamsulosin (FLOMAX) 0.4 MG CAPS capsule Take 0.4 mg by mouth daily.     No facility-administered medications prior to visit.    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   Cataract    removed bilat    Hyperlipidemia    Neuromuscular disorder (HCC)    undetermined at this time , bone spurs    PAST SURGICAL HISTORY: Past Surgical History:  Procedure  Laterality Date   ANTERIOR CERVICAL DECOMP/DISCECTOMY FUSION N/A 11/07/2018   Procedure: ANTERIOR CERVICAL DECOMPRESSION/DISCECTOMY FUSION CERVICAL THREE- CERVICAL FOUR;  Surgeon: Lanis Pupa, MD;  Location: [facility] OR;  Service: Neurosurgery;  Laterality: N/A;  ANTERIOR CERVICAL DECOMPRESSION/DISCECTOMY FUSION CERVICAL THREE- CERVICAL FOUR   CARPAL TUNNEL RELEASE Left 05/30/2018   Procedure: LEFT CARPAL TUNNEL RELEASE;  Surgeon: Murrell Kuba, MD;  Location: [facility];  Service: Orthopedics;  Laterality: Left;    CATARACT EXTRACTION, BILATERAL     COLONOSCOPY W/ POLYPECTOMY  12/2017   DENTAL SURGERY      FAMILY HISTORY: Family History  Problem Relation Age of Onset   Arthritis Mother    Cancer Mother    Cancer Father    Arthritis Maternal Grandmother    Arthritis Paternal Grandmother    Early death Son    Colon polyps Neg Hx    Colon cancer Neg Hx    Esophageal cancer Neg Hx    Rectal cancer Neg Hx    Stomach cancer Neg Hx    Migraines Neg Hx    Seizures Neg Hx    Stroke Neg Hx     SOCIAL HISTORY: Social History   Socioeconomic History   Marital status: Married    Spouse name: Diplomatic Services Operational Officer   Number of children: Not on file   Years of education: Not on file   Highest education level: Not on file  Occupational History   Not on file  Tobacco Use   Smoking status: Never   Smokeless tobacco: Never  Vaping Use   Vaping status: Never Used  Substance and Sexual Activity   Alcohol  use: Not Currently    Comment: 1 drink every month or so   Drug use: Never   Sexual activity: Not on file  Other Topics Concern   Not on file  Social History Narrative   8-10oz cup of Coffee daily and some soda    Social Drivers of Health   Tobacco Use: Low Risk (04/03/2024)   Patient History    Smoking Tobacco Use: Never    Smokeless Tobacco Use: Never    Passive Exposure: Not on file  Financial Resource Strain: Not on file  Food Insecurity: Not on file  Transportation Needs: Not on file  Physical Activity: Not on file  Stress: Not on file  Social Connections: Not on file  Intimate Partner Violence: Not on file  Depression (EYV7-0): Not on file  Alcohol  Screen: Not on file  Housing: Not on file  Utilities: Not on file  Health Literacy: Not on file     PHYSICAL EXAM  GENERAL EXAM/CONSTITUTIONAL: Vitals:  Vitals:   04/03/24 1518  BP: (!) 156/81  Pulse: 61  Weight: 204 lb 3.2 oz (92.6 kg)  Height: 5' 10 (1.778 m)   Body mass index is 29.3 kg/m. Wt Readings from Last 3 Encounters:   04/03/24 204 lb 3.2 oz (92.6 kg)  09/21/22 200 lb 9.6 oz (91 kg)  03/03/21 201 lb (91.2 kg)   Patient is in no distress; well developed, nourished and groomed; neck is supple  CARDIOVASCULAR: Examination of carotid arteries is normal; no carotid bruits Regular rate and rhythm, no murmurs Examination of peripheral vascular system by observation and palpation is normal  EYES: Ophthalmoscopic exam of optic discs and posterior segments is normal; no papilledema or hemorrhages No results found.  MUSCULOSKELETAL: Gait, strength, tone, movements noted in Neurologic exam below  NEUROLOGIC: MENTAL STATUS:      No data to display  awake, alert, oriented to person, place and time recent and remote memory intact normal attention and concentration language fluent, comprehension intact, naming intact fund of knowledge appropriate  CRANIAL NERVE:  2nd - no papilledema on fundoscopic exam 2nd, 3rd, 4th, 6th - pupils equal and reactive to light, visual fields full to confrontation, extraocular muscles intact, no nystagmus 5th - facial sensation symmetric 7th - facial strength symmetric 8th - hearing intact 9th - palate elevates symmetrically, uvula midline 11th - shoulder shrug symmetric 12th - tongue protrusion midline  MOTOR:  normal bulk and tone, full strength in the RUE, RLE LUE DELTOID, BICEPS, TRICEPS 4; FINGER ABDUCTION 3, GRIP 4; SLIGHTLY INCREASE TONE LLE HIP FLEX 3-4, KE 4+, KF 4, DF 3-4; SLIGHTLY INCREASED TONE  SENSORY:  normal and symmetric to light touch, temperature, vibration; DECR IN LEFT FOOT / TOES  COORDINATION:  finger-nose-finger, fine finger movements normal  REFLEXES:  deep tendon reflexes --> SLIGHTLY BRISK IN LUE AND LLE; NEG HOFFMANS; FEW BEATS CLONUS IN LEFT ANKLE  GAIT/STATION:  narrow based gait; ANTALGIC GAIT; LEFT HEMIPARETIC GAIT WITH CIRCUMDUCTION     DIAGNOSTIC DATA (LABS, IMAGING, TESTING) - I reviewed patient records, labs,  notes, testing and imaging myself where available.  Lab Results  Component Value Date   WBC 15.2 (H) 11/08/2018   HGB 15.5 11/08/2018   HCT 46.2 11/08/2018   MCV 91.1 11/08/2018   PLT 216 11/08/2018      Component Value Date/Time   NA 139 11/08/2018 0737   K 4.1 11/08/2018 0737   CL 107 11/08/2018 0737   CO2 21 (L) 11/08/2018 0737   GLUCOSE 123 (H) 11/08/2018 0737   BUN 11 11/08/2018 0737   CREATININE 0.99 11/08/2018 0737   CREATININE 0.93 07/16/2016 0844   CALCIUM  9.4 11/08/2018 0737   PROT 6.6 07/16/2016 0844   ALBUMIN 3.9 07/16/2016 0844   AST 15 07/16/2016 0844   ALT 15 07/16/2016 0844   ALKPHOS 59 07/16/2016 0844   BILITOT 0.5 07/16/2016 0844   GFRNONAA >60 11/08/2018 0737   GFRNONAA 83 07/16/2016 0844   GFRAA >60 11/08/2018 0737   GFRAA >89 07/16/2016 0844   Lab Results  Component Value Date   CHOL 203 (H) 07/16/2016   HDL 43 07/16/2016   LDLCALC 131 (H) 07/16/2016   TRIG 143 07/16/2016   CHOLHDL 4.7 07/16/2016   No results found for: HGBA1C No results found for: [id number] Lab Results  Component Value Date   TSH 0.82 07/16/2016    11/30/23 MRI lumbar spine 1. Multilevel lumbar spondylosis, mildly progressed at L4-L5 where there is moderate-severe right and mild-moderate left foraminal stenosis. 2. Moderate bilateral foraminal stenosis at L5-S1, similar to prior. 3. No significant canal stenosis at any level.    ASSESSMENT AND PLAN  79 y.o. year old male here with:  Dx:  1. Stenosis of cervical spine with myelopathy (HCC)   2. 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 Lanis in July 2023, with repeat MRI in Aug 2023 that was stable - do not suspect peripheral etiology; has significant upper motor neuron signs and clinical history  localizing to cervical spine pathology; EMG/NCS not likely to help dx / tx options - follow up with neurosurgery to consider repeat MRI cervical spine  Return for return to PCP,  pending if symptoms worsen or fail to improve.  I spent 40 minutes of face-to-face and non-face-to-face time with patient.  This included previsit chart review, lab review, study review, order entry, electronic health record documentation, patient education.     EDUARD FABIENE HANLON, MD 04/03/2024, 4:03 PM Certified in Neurology, Neurophysiology and Neuroimaging  [facility] [address], [address] [place], [place] [address] [phone]  "
